# Patient Record
Sex: Female | Born: 1940
Health system: Southern US, Community
[De-identification: ages and names within clinical notes are randomized; demographics above are authoritative.]

## PROBLEM LIST (undated history)

## (undated) DIAGNOSIS — M359 Systemic involvement of connective tissue, unspecified: Secondary | ICD-10-CM

## (undated) DIAGNOSIS — K279 Peptic ulcer, site unspecified, unspecified as acute or chronic, without hemorrhage or perforation: Secondary | ICD-10-CM

## (undated) DIAGNOSIS — M1990 Unspecified osteoarthritis, unspecified site: Secondary | ICD-10-CM

## (undated) DIAGNOSIS — I10 Essential (primary) hypertension: Secondary | ICD-10-CM

## (undated) DIAGNOSIS — N189 Chronic kidney disease, unspecified: Secondary | ICD-10-CM

## (undated) DIAGNOSIS — E039 Hypothyroidism, unspecified: Secondary | ICD-10-CM

## (undated) DIAGNOSIS — J8410 Pulmonary fibrosis, unspecified: Secondary | ICD-10-CM

## (undated) HISTORY — DX: Peptic ulcer, site unspecified, unspecified as acute or chronic, without hemorrhage or perforation: K27.9

## (undated) HISTORY — DX: Systemic involvement of connective tissue, unspecified: M35.9

## (undated) HISTORY — PX: OTHER SURGICAL HISTORY: SHX169

## (undated) HISTORY — DX: Unspecified osteoarthritis, unspecified site: M19.90

---

## 2002-01-07 ENCOUNTER — Ambulatory Visit (HOSPITAL_COMMUNITY): Admission: RE | Admit: 2002-01-07 | Discharge: 2002-01-07 | Payer: Self-pay | Admitting: Specialist

## 2002-01-07 ENCOUNTER — Encounter: Payer: Self-pay | Admitting: Specialist

## 2002-01-19 ENCOUNTER — Ambulatory Visit (HOSPITAL_COMMUNITY): Admission: RE | Admit: 2002-01-19 | Discharge: 2002-01-19 | Payer: Self-pay | Admitting: *Deleted

## 2002-01-19 ENCOUNTER — Encounter: Payer: Self-pay | Admitting: Specialist

## 2003-01-28 ENCOUNTER — Encounter: Payer: Self-pay | Admitting: Family Medicine

## 2003-01-28 ENCOUNTER — Ambulatory Visit (HOSPITAL_COMMUNITY): Admission: RE | Admit: 2003-01-28 | Discharge: 2003-01-28 | Payer: Self-pay | Admitting: Family Medicine

## 2003-02-16 ENCOUNTER — Ambulatory Visit (HOSPITAL_COMMUNITY): Admission: RE | Admit: 2003-02-16 | Discharge: 2003-02-16 | Payer: Self-pay | Admitting: Family Medicine

## 2003-02-16 ENCOUNTER — Encounter: Payer: Self-pay | Admitting: Family Medicine

## 2003-05-31 ENCOUNTER — Ambulatory Visit (HOSPITAL_COMMUNITY): Admission: RE | Admit: 2003-05-31 | Discharge: 2003-05-31 | Payer: Self-pay | Admitting: Family Medicine

## 2003-05-31 ENCOUNTER — Encounter: Payer: Self-pay | Admitting: Family Medicine

## 2003-09-15 ENCOUNTER — Ambulatory Visit (HOSPITAL_COMMUNITY): Admission: RE | Admit: 2003-09-15 | Discharge: 2003-09-15 | Payer: Self-pay | Admitting: Internal Medicine

## 2003-09-16 ENCOUNTER — Ambulatory Visit (HOSPITAL_COMMUNITY): Admission: RE | Admit: 2003-09-16 | Discharge: 2003-09-16 | Payer: Self-pay | Admitting: General Surgery

## 2003-09-16 ENCOUNTER — Encounter: Payer: Self-pay | Admitting: General Surgery

## 2004-03-15 ENCOUNTER — Ambulatory Visit (HOSPITAL_COMMUNITY): Admission: RE | Admit: 2004-03-15 | Discharge: 2004-03-15 | Payer: Self-pay | Admitting: General Surgery

## 2005-04-03 ENCOUNTER — Ambulatory Visit (HOSPITAL_COMMUNITY): Admission: RE | Admit: 2005-04-03 | Discharge: 2005-04-03 | Payer: Self-pay | Admitting: Family Medicine

## 2006-04-04 ENCOUNTER — Ambulatory Visit (HOSPITAL_COMMUNITY): Admission: RE | Admit: 2006-04-04 | Discharge: 2006-04-04 | Payer: Self-pay | Admitting: Family Medicine

## 2007-03-21 ENCOUNTER — Ambulatory Visit (HOSPITAL_COMMUNITY): Admission: RE | Admit: 2007-03-21 | Discharge: 2007-03-21 | Payer: Self-pay | Admitting: Family Medicine

## 2007-10-17 ENCOUNTER — Ambulatory Visit (HOSPITAL_COMMUNITY): Admission: RE | Admit: 2007-10-17 | Discharge: 2007-10-17 | Payer: Self-pay | Admitting: Family Medicine

## 2007-11-11 ENCOUNTER — Ambulatory Visit (HOSPITAL_COMMUNITY): Admission: RE | Admit: 2007-11-11 | Discharge: 2007-11-11 | Payer: Self-pay | Admitting: Family Medicine

## 2008-04-14 ENCOUNTER — Ambulatory Visit (HOSPITAL_COMMUNITY): Admission: RE | Admit: 2008-04-14 | Discharge: 2008-04-14 | Payer: Self-pay | Admitting: Family Medicine

## 2009-05-10 ENCOUNTER — Ambulatory Visit (HOSPITAL_COMMUNITY): Admission: RE | Admit: 2009-05-10 | Discharge: 2009-05-10 | Payer: Self-pay | Admitting: Family Medicine

## 2009-12-29 ENCOUNTER — Ambulatory Visit (HOSPITAL_COMMUNITY): Admission: RE | Admit: 2009-12-29 | Discharge: 2009-12-29 | Payer: Self-pay | Admitting: Ophthalmology

## 2010-01-16 ENCOUNTER — Ambulatory Visit (HOSPITAL_COMMUNITY): Admission: RE | Admit: 2010-01-16 | Discharge: 2010-01-16 | Payer: Self-pay | Admitting: Family Medicine

## 2010-04-20 ENCOUNTER — Ambulatory Visit (HOSPITAL_COMMUNITY): Admission: RE | Admit: 2010-04-20 | Discharge: 2010-04-20 | Payer: Self-pay | Admitting: Ophthalmology

## 2010-06-13 ENCOUNTER — Ambulatory Visit (HOSPITAL_COMMUNITY): Admission: RE | Admit: 2010-06-13 | Discharge: 2010-06-13 | Payer: Self-pay | Admitting: Family Medicine

## 2010-06-21 ENCOUNTER — Ambulatory Visit (HOSPITAL_COMMUNITY): Admission: RE | Admit: 2010-06-21 | Discharge: 2010-06-21 | Payer: Self-pay | Admitting: Family Medicine

## 2011-03-05 ENCOUNTER — Other Ambulatory Visit (HOSPITAL_COMMUNITY): Payer: Self-pay | Admitting: Internal Medicine

## 2011-03-05 DIAGNOSIS — R7989 Other specified abnormal findings of blood chemistry: Secondary | ICD-10-CM

## 2011-03-05 DIAGNOSIS — R634 Abnormal weight loss: Secondary | ICD-10-CM

## 2011-03-06 ENCOUNTER — Encounter (HOSPITAL_COMMUNITY): Payer: Self-pay

## 2011-03-06 ENCOUNTER — Ambulatory Visit (HOSPITAL_COMMUNITY)
Admission: RE | Admit: 2011-03-06 | Discharge: 2011-03-06 | Disposition: A | Discharge status: Home / Self Care | Payer: Medicare Other | Source: Ambulatory Visit | Attending: Internal Medicine | Admitting: Internal Medicine

## 2011-03-06 DIAGNOSIS — R599 Enlarged lymph nodes, unspecified: Secondary | ICD-10-CM | POA: Insufficient documentation

## 2011-03-06 DIAGNOSIS — K7689 Other specified diseases of liver: Secondary | ICD-10-CM | POA: Insufficient documentation

## 2011-03-06 DIAGNOSIS — I10 Essential (primary) hypertension: Secondary | ICD-10-CM | POA: Insufficient documentation

## 2011-03-06 DIAGNOSIS — E039 Hypothyroidism, unspecified: Secondary | ICD-10-CM

## 2011-03-06 DIAGNOSIS — R933 Abnormal findings on diagnostic imaging of other parts of digestive tract: Secondary | ICD-10-CM | POA: Insufficient documentation

## 2011-03-06 DIAGNOSIS — R634 Abnormal weight loss: Secondary | ICD-10-CM

## 2011-03-06 DIAGNOSIS — R7989 Other specified abnormal findings of blood chemistry: Secondary | ICD-10-CM

## 2011-03-06 DIAGNOSIS — R748 Abnormal levels of other serum enzymes: Secondary | ICD-10-CM | POA: Insufficient documentation

## 2011-03-06 DIAGNOSIS — J479 Bronchiectasis, uncomplicated: Secondary | ICD-10-CM | POA: Insufficient documentation

## 2011-03-06 HISTORY — DX: Hypothyroidism, unspecified: E03.9

## 2011-03-06 HISTORY — DX: Essential (primary) hypertension: I10

## 2011-03-06 MED ORDER — IOHEXOL 300 MG/ML  SOLN
100.0000 mL | Freq: Once | INTRAMUSCULAR | Status: AC | PRN
  Administered 2011-03-06: 100 mL via INTRAVENOUS

## 2011-03-20 LAB — HEMOGLOBIN AND HEMATOCRIT, BLOOD
HCT: 39.8 % (ref 36.0–46.0)
Hemoglobin: 14 g/dL (ref 12.0–15.0)

## 2011-03-30 ENCOUNTER — Encounter (HOSPITAL_BASED_OUTPATIENT_CLINIC_OR_DEPARTMENT_OTHER): Payer: Medicare Other | Admitting: Internal Medicine

## 2011-03-30 ENCOUNTER — Ambulatory Visit (HOSPITAL_COMMUNITY)
Admission: RE | Admit: 2011-03-30 | Discharge: 2011-03-30 | Disposition: A | Discharge status: Home / Self Care | Payer: Medicare Other | Source: Ambulatory Visit | Attending: Internal Medicine | Admitting: Internal Medicine

## 2011-03-30 DIAGNOSIS — K2970 Gastritis, unspecified, without bleeding: Secondary | ICD-10-CM | POA: Insufficient documentation

## 2011-03-30 DIAGNOSIS — K449 Diaphragmatic hernia without obstruction or gangrene: Secondary | ICD-10-CM

## 2011-03-30 DIAGNOSIS — R933 Abnormal findings on diagnostic imaging of other parts of digestive tract: Secondary | ICD-10-CM

## 2011-03-30 DIAGNOSIS — K208 Other esophagitis without bleeding: Secondary | ICD-10-CM

## 2011-03-30 DIAGNOSIS — Z79899 Other long term (current) drug therapy: Secondary | ICD-10-CM | POA: Insufficient documentation

## 2011-03-30 DIAGNOSIS — I10 Essential (primary) hypertension: Secondary | ICD-10-CM | POA: Insufficient documentation

## 2011-03-30 DIAGNOSIS — K2900 Acute gastritis without bleeding: Secondary | ICD-10-CM

## 2011-03-30 DIAGNOSIS — R634 Abnormal weight loss: Secondary | ICD-10-CM | POA: Insufficient documentation

## 2011-03-30 DIAGNOSIS — K2990 Gastroduodenitis, unspecified, without bleeding: Secondary | ICD-10-CM | POA: Insufficient documentation

## 2011-03-30 LAB — SEDIMENTATION RATE: Sed Rate: 100 mm/hr — ABNORMAL HIGH (ref 0–22)

## 2011-03-31 LAB — CORTISOL: Cortisol, Plasma: 13.9 ug/dL

## 2011-04-02 LAB — BASIC METABOLIC PANEL
BUN: 12 mg/dL (ref 6–23)
CO2: 29 mEq/L (ref 19–32)
Calcium: 10 mg/dL (ref 8.4–10.5)
Chloride: 100 mEq/L (ref 96–112)
Creatinine, Ser: 1.12 mg/dL (ref 0.4–1.2)
GFR calc Af Amer: 59 mL/min — ABNORMAL LOW (ref >60)
GFR calc non Af Amer: 49 mL/min — ABNORMAL LOW (ref >60)
Glucose, Bld: 106 mg/dL — ABNORMAL HIGH (ref 70–99)
Potassium: 3.7 mEq/L (ref 3.5–5.1)
Sodium: 139 mEq/L (ref 135–145)

## 2011-04-05 ENCOUNTER — Ambulatory Visit (INDEPENDENT_AMBULATORY_CARE_PROVIDER_SITE_OTHER): Payer: BC Managed Care – PPO | Admitting: Internal Medicine

## 2011-04-10 NOTE — Op Note (Signed)
  Michelle Bonilla, Michelle Bonilla              ACCOUNT NO.:  [id number]  MEDICAL RECORD NO.:  [id number]           PATIENT TYPE:  O  LOCATION:  DAYP                          FACILITY:  [facility]  PHYSICIAN:  Lionel December, M.D.    DATE OF BIRTH:  October 30, 1941  DATE OF PROCEDURE:  03/30/2011 DATE OF DISCHARGE:                              OPERATIVE REPORT   PROCEDURE:  Esophagogastroduodenoscopy.  INDICATION:  Bridgitt is a 70 year old Afro-American female who has been gradually losing weight.  She states she has lost 20 pounds over the last year.  Her appetite has been fair.  She was seen by Dr. Juanetta Gosling. In addition to routine lab studies, she had abdominopelvic and chest CT. She was noted to have thickening of the gastric wall in the region of cardia, therefore direct visualization of this area was advised.  She denies epigastric pain, melena, nausea, vomiting, heartburn, or dysphagia.  Procedure risks were reviewed with the patient.  Informed consent was obtained.  MEDICATIONS FOR CONSCIOUS SEDATION:  Cetacaine spray for pharyngeal topical anesthesia, Demerol 25 mg IV, and Versed 4 mg IV.  FINDINGS:  Procedure was performed in endoscopy suite.  The patient's vital signs and O2 sat were monitored during the procedure and remained stable.  The patient was placed in left lateral recumbent position and Pentax videoscope was passed to oropharynx without any difficulty into esophagus.  Esophagus mucosa of the esophagus normal except there were two ulcers at GE junction and mucosa on the gastric site, Z-line was erythematous and edematous, but no stricturing was noted.  GE junction was located at 38 and hiatus was at 40.  Stomach, it was empty and distended very well with insufflation.  Folds of proximal stomach are normal.  Examination of mucosa revealed erythema and edema in the prepyloric/pyloric channel region, but pyloric channel was patent.  Angularis, fundus, and cardia were examined by  retroflexing scope were normal.  Duodenum, bulbar mucosa was normal.  Scope was passed in second part of duodenal mucosa and folds were normal.  Endoscope was withdrawn.  The patient tolerated the procedure well.  FINAL DIAGNOSES: 1. Ulcerative reflux esophagitis with 2 cm size hiatal hernia. 2. Focal gastritis at prepyloric/pyloric channel region, but no     evidence of peptic ulcer disease. 3. These findings would not necessarily explain the patient's     symptoms.  RECOMMENDATIONS: 1. Antireflux measures. 2. Pantoprazole 40 mg p.o. q.a.m.  We will drop in blood for sed rate, cortisol level, and H. pylori serology.     Lionel December, M.D.     NR/MEDQ  D:  03/30/2011  T:  03/31/2011  Job:  [id number]  cc:   Ramon Dredge L. Juanetta Gosling, M.D. Fax: [phone]  Electronically Signed by Lionel December M.D. on 04/10/2011 11:44:34 PM

## 2011-05-04 ENCOUNTER — Other Ambulatory Visit: Payer: Self-pay | Admitting: Rheumatology

## 2011-05-04 DIAGNOSIS — J8410 Pulmonary fibrosis, unspecified: Secondary | ICD-10-CM

## 2011-05-09 ENCOUNTER — Ambulatory Visit
Admission: RE | Admit: 2011-05-09 | Discharge: 2011-05-09 | Disposition: A | Discharge status: Home / Self Care | Payer: Medicare Other | Source: Ambulatory Visit | Attending: Rheumatology | Admitting: Rheumatology

## 2011-05-09 DIAGNOSIS — J8410 Pulmonary fibrosis, unspecified: Secondary | ICD-10-CM

## 2011-05-18 NOTE — Op Note (Signed)
   NAME:  Michelle Bonilla, Michelle Bonilla                        ACCOUNT NO.:  [id number]   MEDICAL RECORD NO.:  [id number]                   PATIENT TYPE:  AMB   LOCATION:  DAY                                  FACILITY:  [facility]   PHYSICIAN:  Lionel December, M.D.                 DATE OF BIRTH:  09/13/1942   DATE OF PROCEDURE:  DATE OF DISCHARGE:                                 OPERATIVE REPORT   PROCEDURE:  Total colonoscopy.   ENDOSCOPIST:  Lionel December, M.D.   INDICATIONS:  Michelle Bonilla is a 70 year old female who is undergoing screening  colonoscopy.  The procedure and risks were reviewed with the patient and  informed consent was obtained.   PREOPERATIVE MEDICATIONS:  Demerol 50 mg IV and Versed 6 mg IV in divided  dose.   FINDINGS:  Procedure performed in endoscopy suite.  The patient's vital  signs and O2 saturation were monitored during the procedure and remained  stable.  The patient was placed in the left lateral recumbent position and  rectal examination was performed.  No abnormality noted on external or  digital exam.   Olympus videoscope was placed in the rectum and advanced under vision into  the sigmoid colon and beyond.  Preparation was excellent.  Scope was passed  to the cecum which was identified by appendiceal orifice and/or stump and  the ileocecal valve.  Pictures were taken for the record.  As the scope was  withdrawn the colonic mucosa was once carefully examined.  There were 2  small diverticula at the right colon.  The rest of the colonic mucosa was  normal.  The rectal mucosa was also normal.   The scope was retroflexed to examine the anorectal junction; and she had  small hemorrhoids below the dentate line. The endoscope was straightened and  withdrawn.  The patient tolerated the procedure well.   FINAL DIAGNOSIS:  Two small diverticula of the right colon and small  external hemorrhoids, otherwise normal examination.    RECOMMENDATIONS:  1. She should continue yearly  Hemoccults when she has a physical exam.  2. High fiber diet.                                               Lionel December, M.D.    NR/MEDQ  D:  09/15/2003  T:  09/15/2003  Job:  [id number]   cc:   Patrica Duel, M.D.  [address], [address]  [place]  [place] [address]  Fax: [phone]

## 2011-05-23 ENCOUNTER — Encounter: Payer: Self-pay | Admitting: Internal Medicine

## 2011-05-24 ENCOUNTER — Encounter: Payer: Self-pay | Admitting: Internal Medicine

## 2011-05-24 ENCOUNTER — Ambulatory Visit (INDEPENDENT_AMBULATORY_CARE_PROVIDER_SITE_OTHER): Payer: Medicare Other | Admitting: Internal Medicine

## 2011-05-24 VITALS — BP 132/78 | HR 90 | Temp 97.8°F | Ht 62.5 in | Wt 102.4 lb

## 2011-05-24 DIAGNOSIS — J8410 Pulmonary fibrosis, unspecified: Secondary | ICD-10-CM

## 2011-05-24 NOTE — Progress Notes (Signed)
Subjective:     Patient ID: Michelle Bonilla, female   DOB: March 04, 1941, 70 y.o.   MRN: [id number]  HPI 62 yobf never smoked perfect health x hpb/ thyroid until Oct 2011 developed rash over chest some better with prednisone then hand swelling p stopped steroids  eval by Nickola Major dx connective tissue dz so restarted on prednisone 04/2011 and referred to Pulmonary clinic by Dr Nickola Major 05/2011 for ILD by CT   05/24/2011 Initial pulmonary office eval in EMR era  For ILD with no limiting sob or cough and good control of arthritis on prednisone at 5 mg three times a day.  Feels joint problems are much better. No unusual exposure hx or hx of taking chemo, amiodarone or macrodantin.  Pt denies any significant sore throat, dysphagia, itching, sneezing,  nasal congestion or excess/ purulent secretions,  fever, chills, sweats, unintended wt loss, pleuritic or exertional cp, hempoptysis, orthopnea pnd or leg swelling.    Also denies any obvious fluctuation of symptoms with weather or environmental changes or other aggravating or alleviating factors.     PMHx Pulmonary Fibrosis/ Connective Tissue dz       - onset Oct 2011 assoc with rash       - CT Chest 05/09/11 Stable chronic interstitial opacities in the lung bases most  compatible with fibrosis.          Mildly prominent bilateral axillary lymph nodes, left slightly  greater than right. These are stable          - 05/24/11 Walked 3 laps @ 185 ft each stopped due to  End of test sats down to 90 RA   but no symptoms    Review of Systems  Constitutional: Positive for unexpected weight change. Negative for fever and chills.  HENT: Negative for ear pain, nosebleeds, congestion, sore throat, rhinorrhea, sneezing, trouble swallowing, dental problem, voice change, postnasal drip and sinus pressure.   Eyes: Negative for visual disturbance.  Respiratory: Negative for cough, choking and shortness of breath.   Cardiovascular: Negative for chest pain and leg swelling.    Gastrointestinal: Negative for vomiting, abdominal pain and diarrhea.  Genitourinary: Negative for difficulty urinating.  Musculoskeletal: Negative for arthralgias.  Skin: Negative for rash.  Neurological: Negative for tremors, syncope and headaches.  Hematological: Does not bruise/bleed easily.       Objective:   Physical Exam Pleasant amb bf nad Wt 102 05/24/11 HEENT: nl dentition, turbinates, and orophanx. Nl external ear canals without cough reflex   NECK :  without JVD/Nodes/TM/ nl carotid upstrokes bilaterally   LUNGS: no acc muscle use, clear to A and P bilaterally without cough on insp or exp maneuvers. No sign crackles   CV:  RRR  no s3 or murmur,   No increase in P2, no edema   ABD:  soft and nontender with nl excursion in the supine position. No bruits or organomegaly, bowel sounds nl  MS:  warm without deformities, calf tenderness, cyanosis.  No clubbing  SKIN: warm and dry without lesions    NEURO:  alert, approp, no deficits    Assessment:         Plan:

## 2011-05-24 NOTE — Patient Instructions (Signed)
Please schedule a follow up office visit in 6 weeks, call sooner if needed with PFTs and CXR   You a have condition that's affecting your joints (pain/swelling) and your lungs (fibrosis with no symptoms) that is very very likely the exact same process and so we need to follow you regularly to make sure the treatment of your joints helps your lungs also.  Stay as active as possible and let me know if cough or shortness of breath with normal activities

## 2011-05-26 NOTE — Assessment & Plan Note (Signed)
DDx for pulmonary fibrosis  includes idiopathic pulmonary fibrosis, pulmonary fibrosis associated with connective tissue diseases (which have a relatively benign course in most cases) , adverse effect from  drugs such as chemotherapy or amiodarone exposure, nonspecific interstitial pneumonia which is typically steroid responsive, and chronic hypersensitivity pneumonitis.   In active  smokers Langerhan's Cell  Histiocyctosis (eosinophilic granuomatosis),  DIP,  and Respiratory Bronchiolitis ILD also need to be considered,    This fits best with PF assoc with CTdz and is presently asymptomatic but clearly present by CT and walking sats  So the key is serial f/u walking sats to put multiple points on the curve to determine response to therapy and also monitor therapy for potential adverse pulmonay effects,  Especially methotrexate if being considered.    Note that  her steroid response is quite striking historically . The goal with a chronic steroid dependent illness is always arriving at the lowest effective dose that controls the disease/symptoms and not accepting a set "formula" which is based on statistics or guidelines that don't always take into account patient  variability or the natural hx of the dz in every individual patient, which may well vary over time.  For now therefore I recommend the patient continue to follow Dr Westley Foots and we will just serve to observe/ monitor her progress from a pulmonary perspective.

## 2011-05-29 ENCOUNTER — Other Ambulatory Visit: Payer: Self-pay | Admitting: Rheumatology

## 2011-05-29 DIAGNOSIS — Z1231 Encounter for screening mammogram for malignant neoplasm of breast: Secondary | ICD-10-CM

## 2011-07-05 ENCOUNTER — Encounter: Payer: Self-pay | Admitting: Internal Medicine

## 2011-07-05 ENCOUNTER — Ambulatory Visit (INDEPENDENT_AMBULATORY_CARE_PROVIDER_SITE_OTHER): Payer: Medicare Other | Admitting: Internal Medicine

## 2011-07-05 ENCOUNTER — Ambulatory Visit (INDEPENDENT_AMBULATORY_CARE_PROVIDER_SITE_OTHER)
Admission: RE | Admit: 2011-07-05 | Discharge: 2011-07-05 | Disposition: A | Discharge status: Home / Self Care | Payer: Medicare Other | Source: Ambulatory Visit | Attending: Internal Medicine | Admitting: Internal Medicine

## 2011-07-05 ENCOUNTER — Ambulatory Visit
Admission: RE | Admit: 2011-07-05 | Discharge: 2011-07-05 | Disposition: A | Discharge status: Home / Self Care | Payer: Medicare Other | Source: Ambulatory Visit | Attending: Rheumatology | Admitting: Rheumatology

## 2011-07-05 VITALS — BP 142/80 | HR 100 | Temp 98.3°F | Ht 63.0 in | Wt 114.0 lb

## 2011-07-05 DIAGNOSIS — J8410 Pulmonary fibrosis, unspecified: Secondary | ICD-10-CM

## 2011-07-05 DIAGNOSIS — Z1231 Encounter for screening mammogram for malignant neoplasm of breast: Secondary | ICD-10-CM

## 2011-07-05 LAB — PULMONARY FUNCTION TEST

## 2011-07-05 NOTE — Progress Notes (Signed)
Subjective:     Patient ID: Michelle Bonilla, female   DOB: 1941/08/08, 70 y.o.   MRN: [id number]  HPI 46 yobf never smoked perfect health x hpb/ thyroid until Oct 2011 developed rash over chest some better with prednisone then hand swelling p stopped steroids  eval by Nickola Major dx connective tissue dz so restarted on prednisone 04/2011 and referred to Pulmonary clinic by Dr Nickola Major 05/2011 for ILD by CT   05/24/2011 Initial pulmonary office eval in EMR era  For ILD with no limiting sob or cough and good control of arthritis on prednisone at 5 mg three times a day.  Feels joint problems are much better. No unusual exposure hx or hx of taking chemo, amiodarone or macrodantin. rec return for pft's/cxr   07/05/2011 ov/Randie Bloodgood cc no significant limiting sob or cough, no flare of arthritis since on prednisone but considering mtx per Dr Nickola Major.    Pt denies any significant sore throat, dysphagia, itching, sneezing,  nasal congestion or excess/ purulent secretions,  fever, chills, sweats, unintended wt loss, pleuritic or exertional cp, hempoptysis, orthopnea pnd or leg swelling.    Also denies any obvious fluctuation of symptoms with weather or environmental changes or other aggravating or alleviating factors.   [name]     PMHx Pulmonary Fibrosis/ Connective Tissue dz................Marland KitchenMarland KitchenZenovia Jordan       - onset Oct 2011 assoc with rash       - Prednisone started 05/2011        - CT Chest 05/09/11 Stable chronic interstitial opacities in the lung bases most  compatible with fibrosis.          Mildly prominent bilateral axillary lymph nodes, left slightly  greater than right. These are stable          - 05/24/11 Walked 3 laps @ 185 ft each stopped due to  End of test sats down to 90 RA   but no symptoms         - 07/05/11 Walked 2 laps @ 185 ft each stopped due to  desat to 86% on RA        Objective:   Physical Exam  Pleasant amb bf nad  Wt 102 05/24/11 >  114 07/05/11  HEENT: nl dentition, turbinates, and  orophanx. Nl external ear canals without cough reflex   NECK :  without JVD/Nodes/TM/ nl carotid upstrokes bilaterally   LUNGS: no acc muscle use, clear to A and P bilaterally without cough on insp or exp maneuvers. No sign crackles   CV:  RRR  no s3 or murmur,   No increase in P2, no edema   ABD:  soft and nontender with nl excursion in the supine position. No bruits or organomegaly, bowel sounds nl  MS:  warm without deformities, calf tenderness, cyanosis.  No clubbing  SKIN: warm and dry without lesions / nodules     cxr 07/05/11 Stable peripheral reticular opacities in the bilateral lower lobes, corresponding to suspected pulmonary fibrosis.   Assessment:         Plan:

## 2011-07-05 NOTE — Patient Instructions (Signed)
Methotrexate's benefit is worth the risk to reduce prednisone need but does carry the risk of worsening your breathing and if this happens you first notice it on exertion and should call me   Please schedule a follow up office visit in 6 weeks, call sooner if needed

## 2011-07-05 NOTE — Progress Notes (Signed)
PFT done today. 

## 2011-07-06 ENCOUNTER — Encounter: Payer: Self-pay | Admitting: Internal Medicine

## 2011-07-06 NOTE — Assessment & Plan Note (Signed)
I had an extended discussion with the patient and daughter  today lasting 15 to 20 minutes of a 25 minute visit on the following issues:   Now desaturating p 2 laps rapid walk  but still asymptomatic with pft's c/w mild restriction and DLC0 49% and cxr very mild > good candidate for trial of mtx but cautioned this could have pulmonary side effects and needs to walk regularly at slower pace to monitor for worsening ild as well as establish multiple point on the curve in the pulmonary clinic

## 2011-08-08 ENCOUNTER — Encounter: Payer: Self-pay | Admitting: Internal Medicine

## 2011-08-22 ENCOUNTER — Encounter: Payer: Self-pay | Admitting: Internal Medicine

## 2011-08-22 ENCOUNTER — Ambulatory Visit (INDEPENDENT_AMBULATORY_CARE_PROVIDER_SITE_OTHER): Payer: Medicare Other | Admitting: Internal Medicine

## 2011-08-22 VITALS — BP 136/80 | HR 100 | Temp 97.5°F | Ht 62.0 in | Wt 121.2 lb

## 2011-08-22 DIAGNOSIS — J8410 Pulmonary fibrosis, unspecified: Secondary | ICD-10-CM

## 2011-08-22 NOTE — Assessment & Plan Note (Signed)
Improving and no longer desats with activity.  F/u can be prn as long as she maintains such good activity tolerance.

## 2011-08-22 NOTE — Progress Notes (Signed)
Subjective:     Patient ID: Michelle Bonilla, female   DOB: 1941-11-13, 70 y.o.   MRN: [id number]  HPI  67 yobf never smoked perfect health x hpb/ thyroid until Oct 2011 developed rash over chest some better with prednisone then hand swelling p stopped steroids  eval by Michelle Bonilla dx connective tissue dz so restarted on prednisone 04/2011 and referred to Pulmonary clinic by Dr Michelle Bonilla 05/2011 for ILD by CT   05/24/2011 Initial pulmonary office eval in EMR era  For ILD with no limiting sob or cough and good control of arthritis on prednisone at 5 mg three times a day.  Feels joint problems are much better. No unusual exposure hx or hx of taking chemo, amiodarone or macrodantin. rec return for pft's/cxr   07/05/2011 ov/Michelle Bonilla cc no significant limiting sob or cough, no flare of arthritis since on prednisone but considering mtx per Dr Michelle Bonilla.   - PFT's 07/05/2011 VC   1.75   74% ratio 89,  DLCO 49% corrects to 84   rec Methotrexate's benefit is worth the risk to reduce prednisone need but does carry the risk of worsening your breathing and if this happens you first notice it on exertion and should call me     08/22/2011 f/u ov/Michelle Bonilla cc less sob, able to walk around a track x 45 minutes at a time s stopping and arthritis is improving. No cough  Sleeping ok without nocturnal  or early am exac of resp c/o's. Pt denies any significant sore throat, dysphagia, itching, sneezing,  nasal congestion or excess/ purulent secretions,  fever, chills, sweats, unintended wt loss, pleuritic or exertional cp, hempoptysis, orthopnea pnd or leg swelling.    Also denies any obvious fluctuation of symptoms with weather or environmental changes or other aggravating or alleviating factors.      [name]     PMHx Pulmonary Fibrosis/ Connective Tissue dz................Marland KitchenMarland KitchenZenovia Bonilla       - onset Oct 2011 assoc with rash       - Prednisone started 05/2011        - CT Chest 05/09/11 Stable chronic interstitial opacities in the lung bases  most  compatible with fibrosis.          Mildly prominent bilateral axillary lymph nodes, left slightly  greater than right. These are stable          - 05/24/11 Walked 3 laps @ 185 ft each stopped due to  End of test sats down to 90 RA   but no symptoms         - 07/05/11 Walked 2 laps @ 185 ft each stopped due to  desat to 86% on RA       - 08/22/2011 Walked RA x 3 laps @ 185 ft each stopped due to  End of study/ no desat       Objective:   Physical Exam  Pleasant amb bf nad  Wt 102 05/24/11 >  114 07/05/11  > 121 08/22/2011   HEENT: nl dentition, turbinates, and orophanx. Nl external ear canals without cough reflex   NECK :  without JVD/Nodes/TM/ nl carotid upstrokes bilaterally   LUNGS: no acc muscle use, clear to A and P bilaterally without cough on insp or exp maneuvers. No sign crackles/minimal BV changes both bases   CV:  RRR  no s3 or murmur,   No increase in P2, no edema   ABD:  soft and nontender with nl excursion in the supine position. No bruits  or organomegaly, bowel sounds nl  MS:  warm without deformities, calf tenderness, cyanosis.  No clubbing  SKIN: warm and dry without lesions / nodules     cxr 07/05/11 Stable peripheral reticular opacities in the bilateral lower lobes, corresponding to suspected pulmonary fibrosis.   Assessment:         Plan:

## 2011-08-22 NOTE — Patient Instructions (Signed)
GERD (REFLUX)  is an extremely common cause of respiratory symptoms, many times with no significant heartburn at all.    It can be treated with medication, but also with lifestyle changes including avoidance of late meals, excessive alcohol, smoking cessation, and avoid fatty foods, chocolate, peppermint, colas, red wine, and acidic juices such as orange juice.  NO MINT OR MENTHOL PRODUCTS SO NO COUGH DROPS  USE SUGARLESS CANDY INSTEAD (jolley ranchers or Stover's)  NO OIL BASED VITAMINS (no fish oil)   Please schedule a follow up visit in 6  months but call sooner if needed - let me know if your  tolerance to track walking declines.

## 2012-02-21 ENCOUNTER — Ambulatory Visit: Payer: Medicare Other | Admitting: Internal Medicine

## 2012-04-02 ENCOUNTER — Ambulatory Visit (INDEPENDENT_AMBULATORY_CARE_PROVIDER_SITE_OTHER): Payer: Medicare Other | Admitting: Internal Medicine

## 2012-04-02 ENCOUNTER — Encounter: Payer: Self-pay | Admitting: Internal Medicine

## 2012-04-02 VITALS — BP 142/80 | HR 101 | Temp 98.5°F | Ht 62.5 in | Wt 109.8 lb

## 2012-04-02 DIAGNOSIS — J8410 Pulmonary fibrosis, unspecified: Secondary | ICD-10-CM

## 2012-04-02 DIAGNOSIS — I10 Essential (primary) hypertension: Secondary | ICD-10-CM

## 2012-04-02 NOTE — Assessment & Plan Note (Signed)
ACE inhibitors are problematic in  pts with airway complaints because  even experienced pulmonologists can't always distinguish ace effects from copd/asthma/pnds/ allergies etc.  By themselves they don't actually cause a problem, much like oxygen can't by itself start a fire, but they certainly serve as a powerful catalyst or enhancer for any "fire"  or inflammatory process in the upper airway, be it caused by an ET  tube or more commonly reflux (especially in the obese or pts with known GERD or who are on biphoshonates) or URI's, due to interference with bradykinin clearance.  The effects of acei on bradykinin levels occurs in 100% of pt's on acei (unless they surreptitiously stop the med!) but the classic cough is only reported in 5%.  This leaves 95% of pts on acei's  with a variety of syndromes including no identifiable symptom in most  vs non-specific symptoms that wax and wane depending on what other insult is occuring at the level of the upper airway.   She has urge to clear her throat more bothersome to daughter than pt but likely an acei effect > would prefer ARB if possible given that cough is one of the symptoms we're looking for to monitor her ILD

## 2012-04-02 NOTE — Assessment & Plan Note (Signed)
-   onset Oct 2011 assoc with rash       - CT Chest 05/09/11 Stable chronic interstitial opacities in the lung bases most  compatible with fibrosis.          Mildly prominent bilateral axillary lymph nodes, left slightly  greater than right. These are stable          - 05/24/11 Walked 3 laps @ 185 ft each stopped due to  End of test sats down to 90 RA   but no symptoms       - 07/05/2011 Walked 2 laps @ 185 ft each stopped due to desat to 86% RA at rapid pace, no symptoms       - PFT's 07/05/2011 VC   1.75   74% ratio 89,  DLCO 49% corrects to 84         - Methotrexate  started  07/2011       - 08/22/2011 Walked RA x 3 laps @ 185 ft each stopped due to  End of study/ no desat  No evidence of worsening ild, minimal cough more likely acei than related to ild

## 2012-04-02 NOTE — Progress Notes (Signed)
Subjective:     Patient ID: Michelle Bonilla, female   DOB: 12-12-1941    MRN: [id number]  Brief patient profile:  32 yobf never smoked perfect health x hpb/ thyroid until Oct 2011 developed rash over chest some better with prednisone then hand swelling p stopped steroids  eval by Nickola Major dx connective tissue dz so restarted on prednisone 04/2011 and referred to Pulmonary clinic by Dr Nickola Major 05/2011 for ILD by CT   05/24/2011 Initial pulmonary office eval in EMR era  For ILD with no limiting sob or cough and good control of arthritis on prednisone at 5 mg three times a day.  Feels joint problems are much better. No unusual exposure hx or hx of taking chemo, amiodarone or macrodantin. rec return for pft's/cxr   07/05/2011 ov/Ahriyah Vannest cc no significant limiting sob or cough, no flare of arthritis since on prednisone but considering mtx per Dr Nickola Major.   - PFT's 07/05/2011 VC   1.75   74% ratio 89,  DLCO 49% corrects to 84   rec Methotrexate's benefit is worth the risk to reduce prednisone need but does carry the risk of worsening your breathing and if this happens you first notice it on exertion and should call me     08/22/2011 f/u ov/Jakayden Cancio cc less sob, able to walk around a track x 45 minutes at a time s stopping and arthritis is improving. No cough rec gerd diet Monitor ability to walk track   04/02/2012 f/u ov/Gailya Tauer cc no change doe, not doing track due to weather but plans to start again, "not coughing" but clearing throat a lot since started on acei, attibutes this to "sinus drainage" but didn't have it before. Arthritis good shape  Sleeping ok without nocturnal  or early am exac of resp c/o's. Pt denies any significant sore throat, dysphagia, itching, sneezing,  nasal congestion or excess/ purulent secretions,  fever, chills, sweats, unintended wt loss, pleuritic or exertional cp, hempoptysis, orthopnea pnd or leg swelling.    Also denies any obvious fluctuation of symptoms with weather or environmental  changes or other aggravating or alleviating factors.      [name]     PMHx Pulmonary Fibrosis/ Connective Tissue dz................Marland KitchenMarland KitchenZenovia Jordan       - onset Oct 2011 assoc with rash       - Prednisone started 05/2011        - CT Chest 05/09/11 Stable chronic interstitial opacities in the lung bases most  compatible with fibrosis.          Mildly prominent bilateral axillary lymph nodes, left slightly  greater than right. These are stable          - 05/24/11 Walked 3 laps @ 185 ft each stopped due to  End of test sats down to 90 RA   but no symptoms         - 07/05/11 Walked 2 laps @ 185 ft each stopped due to  desat to 86% on RA       - 08/22/2011 Walked RA x 3 laps @ 185 ft each stopped due to  End of study/ no desat       Objective:   Physical Exam  Pleasant amb bf nad with freq throat clearing during interview  Wt 102 05/24/11 >  114 07/05/11  > 121 08/22/2011  > 04/02/2012  109 HEENT: nl dentition, turbinates, and orophanx. Nl external ear canals without cough reflex   NECK :  without JVD/Nodes/TM/ nl carotid upstrokes  bilaterally   LUNGS: no acc muscle use, clear to A and P bilaterally without cough on insp or exp maneuvers. No sign crackles/minimal BV changes both bases   CV:  RRR  no s3 or murmur,   No increase in P2, no edema   ABD:  soft and nontender with nl excursion in the supine position. No bruits or organomegaly, bowel sounds nl  MS:  warm without deformities, calf tenderness, cyanosis.  No clubbing  SKIN: warm and dry without lesions / nodules     cxr 07/05/11 Stable peripheral reticular opacities in the bilateral lower lobes, corresponding to suspected pulmonary fibrosis.   Assessment:         Plan:

## 2012-04-02 NOTE — Patient Instructions (Signed)
Please schedule a follow up visit in 3 months but call sooner if needed   If throat clearing bothers you it is probably from the lisinopril which will need to be stopped but your blood pressure will need to be monitored carefully and alternatives to ace inhibitors considered

## 2012-06-13 ENCOUNTER — Other Ambulatory Visit: Payer: Self-pay | Admitting: Rheumatology

## 2012-06-13 DIAGNOSIS — Z1231 Encounter for screening mammogram for malignant neoplasm of breast: Secondary | ICD-10-CM

## 2012-07-04 ENCOUNTER — Ambulatory Visit (INDEPENDENT_AMBULATORY_CARE_PROVIDER_SITE_OTHER): Payer: Medicare Other | Admitting: Internal Medicine

## 2012-07-04 ENCOUNTER — Encounter: Payer: Self-pay | Admitting: Internal Medicine

## 2012-07-04 VITALS — BP 158/90 | HR 92 | Temp 97.2°F | Ht 62.5 in | Wt 102.6 lb

## 2012-07-04 DIAGNOSIS — J8410 Pulmonary fibrosis, unspecified: Secondary | ICD-10-CM

## 2012-07-04 DIAGNOSIS — I10 Essential (primary) hypertension: Secondary | ICD-10-CM

## 2012-07-04 NOTE — Progress Notes (Signed)
Subjective:     Patient ID: Michelle Bonilla, female   DOB: 1941-12-29    MRN: [id number]   Brief patient profile:  63 yobf never smoked perfect health x hpb/ thyroid until Oct 2011 developed rash over chest some better with prednisone then hand swelling p stopped steroids  eval by Nickola Major dx connective tissue dz so restarted on prednisone 04/2011 and referred to Pulmonary clinic by Dr Nickola Major 05/2011 for ILD by CT   05/24/2011 Initial pulmonary office eval in EMR era  For ILD with no limiting sob or cough and good control of arthritis on prednisone at 5 mg three times a day.  Feels joint problems are much better. No unusual exposure hx or hx of taking chemo, amiodarone or macrodantin. rec return for pft's/cxr   07/05/2011 ov/Eupha Lobb cc no significant limiting sob or cough, no flare of arthritis since on prednisone but considering mtx per Dr Nickola Major.   - PFT's 07/05/2011 VC   1.75   74% ratio 89,  DLCO 49% corrects to 84   rec Methotrexate's benefit is worth the risk to reduce prednisone need but does carry the risk of worsening your breathing and if this happens you first notice it on exertion and should call me     08/22/2011 f/u ov/Letita Prentiss cc less sob, able to walk around a track x 45 minutes at a time s stopping and arthritis is improving. No cough rec gerd diet Monitor ability to walk track   04/02/2012 f/u ov/Lemont Sitzmann cc no change doe, not doing track due to weather but plans to start again, "not coughing" but clearing throat a lot since started on acei, attibutes this to "sinus drainage" but didn't have it before started acei. Arthritis good shape rec Please schedule a follow up visit in 3 months but call sooner if needed - consider d/c acei > done.   07/04/2012 f/u ov/Jonavon Trieu cc back to normal walking and throat problem completely cleared off acei, arthritis doing fine.    No unusual cough, purulent sputum or sinus/hb symptoms on present rx. No obvious daytime variabilty or   cp or chest tightness, subjective  wheeze. No unusual exp hx        Sleeping ok without nocturnal  or early am exac of resp c/o's. Also denies any obvious fluctuation of symptoms with weather or environmental changes or other aggravating or alleviating factors.      [name]     PMHx Pulmonary Fibrosis/ Connective Tissue dz................Marland KitchenMarland KitchenZenovia Jordan       - onset Oct 2011 assoc with rash       - Prednisone started 05/2011        - CT Chest 05/09/11 Stable chronic interstitial opacities in the lung bases most  compatible with fibrosis.          Mildly prominent bilateral axillary lymph nodes, left slightly  greater than right. These are stable           Objective:   Physical Exam  Pleasant amb bf nad    Wt 102 05/24/11 >  114 07/05/11  > 121 08/22/2011  > 04/02/2012  109 > 07/04/2012  102 HEENT: nl dentition, turbinates, and orophanx. Nl external ear canals without cough reflex   NECK :  without JVD/Nodes/TM/ nl carotid upstrokes bilaterally   LUNGS: no acc muscle use, clear to A and P bilaterally without cough on insp or exp maneuvers. No sign crackles/minimal BV changes both bases   CV:  RRR  no s3 or murmur,  No increase in P2, no edema   ABD:  soft and nontender with nl excursion in the supine position. No bruits or organomegaly, bowel sounds nl  MS:  warm without deformities, calf tenderness, cyanosis.  No clubbing  SKIN: warm and dry without lesions / nodules     cxr 07/05/11 Stable peripheral reticular opacities in the bilateral lower lobes, corresponding to suspected pulmonary fibrosis.   Assessment:         Plan:

## 2012-07-04 NOTE — Assessment & Plan Note (Signed)
Clearly better off acei in terms of airway complaints, all be they non-specific "throat congestion" related  Defer decision re rx to Dr Clent Ridges but would avoid acei's since they probably contribute in her case to the symptoms we'd be looking for to monitor her ILD for lack of progression.

## 2012-07-04 NOTE — Patient Instructions (Addendum)
Return if Dr Nickola Major needs you have yearly pulmonary function tests or if you notice you are losing any ground with your exercise tolerance

## 2012-07-04 NOTE — Assessment & Plan Note (Signed)
-   onset Oct 2011 assoc with rash       - CT Chest 05/09/11 Stable chronic interstitial opacities in the lung bases most  compatible with fibrosis.          Mildly prominent bilateral axillary lymph nodes, left slightly  greater than right. These are stable          - 05/24/11 Walked 3 laps @ 185 ft each stopped due to  End of test sats down to 90 RA   but no symptoms       - 07/05/2011 Walked 2 laps @ 185 ft each stopped due to desat to 86% RA at rapid pace, no symptoms       - PFT's 07/05/2011 VC   1.75   74% ratio 89,  DLCO 49% corrects to 84         - Methotrexate rx 07/2011>>>       -07/04/2012  Walked RA x 3 laps @ 185 ft each stopped due to  End of study, sats 90RA  Walking sats identical now to what they were 05/24/11 so no evidence of dz progression.  If decline in ex tol or needed by Dr Nickola Major for monitoring RA can do yearly cxr/ pft's here, otherwise f/u can be prn.

## 2012-07-10 ENCOUNTER — Ambulatory Visit
Admission: RE | Admit: 2012-07-10 | Discharge: 2012-07-10 | Disposition: A | Discharge status: Home / Self Care | Payer: Medicare Other | Source: Ambulatory Visit | Attending: Rheumatology | Admitting: Rheumatology

## 2012-07-10 DIAGNOSIS — Z1231 Encounter for screening mammogram for malignant neoplasm of breast: Secondary | ICD-10-CM

## 2012-07-15 ENCOUNTER — Other Ambulatory Visit: Payer: Self-pay | Admitting: Rheumatology

## 2012-07-15 DIAGNOSIS — R928 Other abnormal and inconclusive findings on diagnostic imaging of breast: Secondary | ICD-10-CM

## 2012-07-24 ENCOUNTER — Ambulatory Visit
Admission: RE | Admit: 2012-07-24 | Discharge: 2012-07-24 | Disposition: A | Discharge status: Home / Self Care | Payer: Medicare Other | Source: Ambulatory Visit | Attending: Rheumatology | Admitting: Rheumatology

## 2012-07-24 DIAGNOSIS — R928 Other abnormal and inconclusive findings on diagnostic imaging of breast: Secondary | ICD-10-CM

## 2012-12-05 ENCOUNTER — Other Ambulatory Visit: Payer: Self-pay | Admitting: Rheumatology

## 2012-12-05 DIAGNOSIS — R918 Other nonspecific abnormal finding of lung field: Secondary | ICD-10-CM

## 2012-12-08 ENCOUNTER — Ambulatory Visit
Admission: RE | Admit: 2012-12-08 | Discharge: 2012-12-08 | Disposition: A | Discharge status: Home / Self Care | Payer: Medicare Other | Source: Ambulatory Visit | Attending: Rheumatology | Admitting: Rheumatology

## 2012-12-08 ENCOUNTER — Other Ambulatory Visit: Payer: Medicare Other

## 2012-12-08 DIAGNOSIS — R918 Other nonspecific abnormal finding of lung field: Secondary | ICD-10-CM

## 2012-12-10 ENCOUNTER — Telehealth: Payer: Self-pay | Admitting: Internal Medicine

## 2012-12-10 NOTE — Telephone Encounter (Signed)
Called and spoke with pt to remind her to be sure and keep 12/17/12 ov with Dr. Sherene Sires to f/u recent CT Chest. Pt verbalized understanding of appt date, time and location and states will keep appt.

## 2012-12-17 ENCOUNTER — Encounter: Payer: Self-pay | Admitting: Internal Medicine

## 2012-12-17 ENCOUNTER — Other Ambulatory Visit (INDEPENDENT_AMBULATORY_CARE_PROVIDER_SITE_OTHER): Payer: Medicare Other

## 2012-12-17 ENCOUNTER — Ambulatory Visit (INDEPENDENT_AMBULATORY_CARE_PROVIDER_SITE_OTHER): Payer: Medicare Other | Admitting: Internal Medicine

## 2012-12-17 VITALS — BP 114/70 | HR 101 | Temp 99.0°F | Ht 62.0 in | Wt 93.0 lb

## 2012-12-17 DIAGNOSIS — J8410 Pulmonary fibrosis, unspecified: Secondary | ICD-10-CM

## 2012-12-17 LAB — CBC WITH DIFFERENTIAL/PLATELET
Basophils Absolute: 0 10*3/uL (ref 0.0–0.1)
Basophils Relative: 0.4 % (ref 0.0–3.0)
Eosinophils Absolute: 0.2 10*3/uL (ref 0.0–0.7)
Eosinophils Relative: 3.9 % (ref 0.0–5.0)
HCT: 33.4 % — ABNORMAL LOW (ref 36.0–46.0)
Hemoglobin: 11.1 g/dL — ABNORMAL LOW (ref 12.0–15.0)
Lymphocytes Relative: 16.4 % (ref 12.0–46.0)
Lymphs Abs: 1 10*3/uL (ref 0.7–4.0)
MCHC: 33.3 g/dL (ref 30.0–36.0)
MCV: 93.1 fl (ref 78.0–100.0)
Monocytes Absolute: 0.4 10*3/uL (ref 0.1–1.0)
Monocytes Relative: 7.3 % (ref 3.0–12.0)
Neutro Abs: 4.4 10*3/uL (ref 1.4–7.7)
Neutrophils Relative %: 72 % (ref 43.0–77.0)
Platelets: 433 10*3/uL — ABNORMAL HIGH (ref 150.0–400.0)
RBC: 3.59 Mil/uL — ABNORMAL LOW (ref 3.87–5.11)
RDW: 15.2 % — ABNORMAL HIGH (ref 11.5–14.6)
WBC: 6.1 10*3/uL (ref 4.5–10.5)

## 2012-12-17 MED ORDER — PREDNISONE 10 MG PO TABS: ORAL_TABLET | ORAL | Status: DC

## 2012-12-17 NOTE — Assessment & Plan Note (Signed)
-   onset Oct 2011 assoc with rash       - CT Chest 05/09/11 Stable chronic interstitial opacities in the lung bases most  compatible with fibrosis.          Mildly prominent bilateral axillary lymph nodes, left slightly  greater than right. These are stable          - 05/24/11 Walked 3 laps @ 185 ft each stopped due to  End of test sats down to 90 RA   but no symptoms       - 07/05/2011 Walked 2 laps @ 185 ft each stopped due to desat to 86% RA at rapid pace, no symptoms       - PFT's 07/05/2011 VC   1.75   74% ratio 89,  DLCO 49% corrects to 84         - Methotrexate rx 07/2011>>> 12/17/2012 ? mtx toxicity       -07/04/2012  Walked RA x 3 laps @ 185 ft each stopped due to  End of study, sats       - 12/17/2012  Walked RA  2 laps @ 185 ft each stopped due to  desat to 75%   DDx for pulmonary fibrosis  includes idiopathic pulmonary fibrosis, pulmonary fibrosis associated with rheumatologic diseases (which have a relatively benign course in most cases) , adverse effect from  drugs such as chemotherapy or amiodarone exposure, nonspecific interstitial pneumonia which is typically steroid responsive, and chronic hypersensitivity pneumonitis.   In active  smokers Langerhan's Cell  Histiocyctosis (eosinophilic granuomatosis),  DIP,  and Respiratory Bronchiolitis ILD also need to be considered,    The fact her breathing and cough deteriorating while joints better on mtx strongly points toward possible mtx toxicity and away from pf related to RA  Discussed in detail all the  indications, usual  risks and alternatives  relative to the benefits with patient who agrees to proceed with trial off mtx for now and back on pred at 10 mg bid x 4-6 weeks.  The goal with a chronic steroid dependent illness is always arriving at the lowest effective dose that controls the disease/symptoms and not accepting a set "formula" which is based on statistics or guidelines that don't always take into account patient  variability or  the natural hx of the dz in every individual patient, which may well vary over time.  For now therefore I recommend the patient maintain  A ceiling of 20 mg and a floor of 10 mg per day for now

## 2012-12-17 NOTE — Patient Instructions (Addendum)
Continue protonix 40 mg Take 30-60 min before first meal of the day   Prednisone 10 mg twice daily with meals and once better, one half twice daily  Please schedule a follow up office visit in 6 weeks, call sooner if needed with pft's

## 2012-12-17 NOTE — Progress Notes (Signed)
Subjective:     Patient ID: Michelle Bonilla, female   DOB: Jun 16, 1941    MRN: [id number]   Brief patient profile:  61 yobf never smoked perfect health x hpb/ thyroid until Oct 2011 developed rash over chest some better with prednisone then hand swelling p stopped steroids  eval by Michelle Bonilla dx connective tissue dz so restarted on prednisone 04/2011 and referred to Pulmonary clinic by Dr Michelle Bonilla 05/2011 for ILD by CT   05/24/2011 Initial pulmonary office eval in EMR era  For ILD with no limiting sob or cough and good control of arthritis on prednisone at 5 mg three times a day.  Feels joint problems are much better. No unusual exposure hx or hx of taking chemo, amiodarone or macrodantin. rec return for pft's/cxr   07/05/2011 ov/Wert cc no significant limiting sob or cough, no flare of arthritis since on prednisone but considering mtx per Dr Michelle Bonilla.   - PFT's 07/05/2011 VC   1.75   74% ratio 89,  DLCO 49% corrects to 84   rec  Methotrexate's benefit is worth the risk to reduce prednisone need but does carry the risk of worsening your breathing and if this happens you first notice it on exertion and should call me     08/22/2011 f/u ov/Wert cc less sob, able to walk around a track x 45 minutes at a time s stopping and arthritis is improving. No cough rec gerd diet Monitor ability to walk track   04/02/2012 f/u ov/Wert cc no change doe, not doing track due to weather but plans to start again, "not coughing" but clearing throat a lot since started on acei, attibutes this to "sinus drainage" but didn't have it before started acei. Arthritis good shape rec Please schedule a follow up visit in 3 months but call sooner if needed - consider d/c acei > done.   07/04/2012 f/u ov/Wert cc back to normal walking and throat problem completely cleared off acei, arthritis doing fine off prednisone for months s flare. rec  f/u prn   12/17/2012 f/u ov/Wert ccindolent onset progressive sense she's loosing ground with  activity tol, fatigue since off prednisone and sev months dry cough despite off acei and on ppi daily.  Arthritis well controlled as mtx tapered up  No purulent sputum or sinus/hb symptoms on present rx. No obvious daytime variabilty or   cp or chest tightness, subjective wheeze. No unusual exp hx     Sleeping ok without nocturnal  or early am exac of resp c/o's. Also denies any obvious fluctuation of symptoms with weather or environmental changes or other aggravating or alleviating factors.    ROS  The following are not active complaints unless bolded sore throat, dysphagia, dental problems, itching, sneezing,  nasal congestion or excess/ purulent secretions, ear ache,   fever, chills, sweats, unintended wt loss, pleuritic or exertional cp, hemoptysis,  orthopnea pnd or leg swelling, presyncope, palpitations, heartburn, abdominal pain, anorexia, nausea, vomiting, diarrhea  or change in bowel or urinary habits, change in stools or urine, dysuria,hematuria,  rash, arthralgias, visual complaints, headache, numbness weakness or ataxia or problems with walking or coordination,  change in mood/affect or memory.        Michelle Bonilla Kitchen     PMHx Pulmonary Fibrosis/ Connective Tissue dz................Michelle Bonilla KitchenMarland KitchenZenovia Bonilla       - onset Oct 2011 assoc with rash       - Prednisone started 05/2011        - CT Chest 05/09/11 Stable chronic interstitial  opacities in the lung bases most  compatible with fibrosis.          Mildly prominent bilateral axillary lymph nodes, left slightly  greater than right. These are stable           Objective:   Physical Exam  Pleasant amb bf nad    Wt 102 05/24/11 >  114 07/05/11  > 121 08/22/2011  > 04/02/2012  109 > 07/04/2012  102 > 93 12/17/2012  HEENT: nl dentition, turbinates, and orophanx. Nl external ear canals without cough reflex   NECK :  without JVD/Nodes/TM/ nl carotid upstrokes bilaterally   LUNGS: no acc muscle use, clear to A and P bilaterally without cough on insp or exp  maneuvers. No sign crackles/minimal BV changes both bases   CV:  RRR  no s3 or murmur,   No increase in P2, no edema   ABD:  soft and nontender with nl excursion in the supine position. No bruits or organomegaly, bowel sounds nl  MS:  warm without deformities, calf tenderness, cyanosis.  No clubbing  SKIN: warm and dry without lesions / nodules     Ct 12/08/12 Progressive pulmonary fibrosis and bronchiectasis. No pulmonary  masses. The new densities on chest x-ray represent focal areas of  fibrosis and secondary inflammation.    Assessment:         Plan:

## 2012-12-18 ENCOUNTER — Ambulatory Visit: Payer: Medicare Other | Admitting: Internal Medicine

## 2012-12-19 ENCOUNTER — Telehealth: Payer: Self-pay | Admitting: Internal Medicine

## 2012-12-19 NOTE — Telephone Encounter (Signed)
Result Note     Call patient : Studies are unremarkable, no change in recs   I spoke with patient about results and she verbalized understanding and had no questions

## 2012-12-22 ENCOUNTER — Ambulatory Visit: Payer: Medicare Other | Admitting: Internal Medicine

## 2013-02-05 ENCOUNTER — Encounter: Payer: Self-pay | Admitting: Internal Medicine

## 2013-02-05 ENCOUNTER — Ambulatory Visit (INDEPENDENT_AMBULATORY_CARE_PROVIDER_SITE_OTHER): Payer: Medicare Other | Admitting: Internal Medicine

## 2013-02-05 ENCOUNTER — Telehealth: Payer: Self-pay | Admitting: Internal Medicine

## 2013-02-05 VITALS — BP 144/92 | HR 98 | Temp 97.6°F | Ht 62.0 in | Wt 104.0 lb

## 2013-02-05 DIAGNOSIS — J8410 Pulmonary fibrosis, unspecified: Secondary | ICD-10-CM

## 2013-02-05 LAB — PULMONARY FUNCTION TEST

## 2013-02-05 MED ORDER — CLONIDINE HCL 0.1 MG PO TABS: 0.1000 mg | ORAL_TABLET | Freq: Two times a day (BID) | ORAL | Status: DC

## 2013-02-05 NOTE — Patient Instructions (Addendum)
Stop tenex  Start clonidine 0.1 mg twice daily in place of tenex (ok to break am dose in half if too sleepy)   Defer dosing of prednisone to Dr  Nickola Major note before restarted it you lab result was: Lab Results  Component Value Date   ESRSEDRATE 51* 12/17/2012     Please schedule a follow up visit in 3 months but call sooner if needed

## 2013-02-05 NOTE — Telephone Encounter (Signed)
Yes, it replaces tenex which she has tolerated fine

## 2013-02-05 NOTE — Assessment & Plan Note (Signed)
-   onset Oct 2011 assoc with rash       - CT Chest 05/09/11 Stable chronic interstitial opacities in the lung bases most  compatible with fibrosis.          Mildly prominent bilateral axillary lymph nodes, left slightly  greater than right. These are stable          - 05/24/11 Walked 3 laps @ 185 ft each stopped due to  End of test sats down to 90 RA   but no symptoms       - 07/05/2011 Walked 2 laps @ 185 ft each stopped due to desat to 86% RA at rapid pace, no symptoms       - PFT's 07/05/2011 VC   1.75   74% ratio 89,  DLCO 49% corrects to 84        - PFT's 02/05/2013  VC   1.94  75% ratio 83 and DLCO 64% corrects 85        - Methotrexate rx 07/2011>>> 12/17/2012 ? mtx toxicity       -07/04/2012  Walked RA x 3 laps @ 185 ft each stopped due to  End of study, sats ok      - 12/17/2012  Walked RA  2 laps @ 185 ft each stopped due to  desat to 75%> restarted pred and d/c mtx      - 02/05/2013  Walked RA  2 laps @ 185 ft each stopped due to  Sat 89%       - 12/17/2012  ESR 51  Clearly has improved her ex tol/ ex sats suggesting steroid responsive ILD more likely related to mtx than RA but always tough to know as we stopped the mtx when the prednisone was restarted but the absence of arthritis flare when the breathing worsened point more to the former than the latter in the ddx  For now rec maintain at present dose of 5 mg bid and see Dr Nickola Major for maint rx.

## 2013-02-05 NOTE — Telephone Encounter (Signed)
Pharmacist states the Clonidine prescribed today may interact with the Atenolol causing "life threatening hypotension".  MW, pls advise if pt should still take both medications.

## 2013-02-05 NOTE — Progress Notes (Signed)
PFT done today. 

## 2013-02-05 NOTE — Telephone Encounter (Signed)
Pharmacist aware that it is fine for pt to take Atenolol and Clonidine per Dr. Sherene Sires.

## 2013-02-05 NOTE — Progress Notes (Signed)
Subjective:     Patient ID: Michelle Bonilla, female   DOB: 1941/06/12    MRN: [id number]   Brief patient profile:  35 yobf never smoked perfect health x hpb/ thyroid until Oct 2011 developed rash over chest some better with prednisone then hand swelling p stopped steroids  eval by Michelle Bonilla dx connective tissue dz so restarted on prednisone 04/2011 and referred to Pulmonary clinic by Dr Michelle Bonilla 05/2011 for ILD by CT   05/24/2011 Initial pulmonary office eval in EMR era  For ILD with no limiting sob or cough and good control of arthritis on prednisone at 5 mg three times a day.  Feels joint problems are much better. No unusual exposure hx or hx of taking chemo, amiodarone or macrodantin. rec return for pft's/cxr   07/05/2011 ov/Michelle Bonilla cc no significant limiting sob or cough, no flare of arthritis since on prednisone but considering mtx per Dr Michelle Bonilla.   - PFT's 07/05/2011 VC   1.75   74% ratio 89,  DLCO 49% corrects to 84   rec  Methotrexate's benefit is worth the risk to reduce prednisone need but does carry the risk of worsening your breathing and if this happens you first notice it on exertion and should call me     08/22/2011 f/u ov/Michelle Bonilla cc less sob, able to walk around a track x 45 minutes at a time s stopping and arthritis is improving. No cough rec gerd diet Monitor ability to walk track   04/02/2012 f/u ov/Michelle Bonilla cc no change doe, not doing track due to weather but plans to start again, "not coughing" but clearing throat a lot since started on acei, attibutes this to "sinus drainage" but didn't have it before started acei. Arthritis good shape rec Please schedule a follow up visit in 3 months but call sooner if needed - consider d/c acei > done.   07/04/2012 f/u ov/Michelle Bonilla cc back to normal walking and throat problem completely cleared off acei, arthritis doing fine off prednisone for months s flare. rec  f/u prn   12/17/2012 f/u ov/Michelle Bonilla cc indolent onset progressive sense she's loosing ground with  activity tol, fatigue since off prednisone and sev months dry cough despite off acei and on ppi daily.  Arthritis well controlled as mtx titrated up rec Continue protonix 40 mg Take 30-60 min before first meal of the day  Prednisone 10 mg twice daily with meals and once better, one half twice daily   02/05/2013 f/u ov/Michelle Bonilla cc all smiles, overall much better cough gone, energy is main limiting problems, not sob, and no arthritis flare off mtx  No purulent sputum or sinus/hb symptoms on present rx. No obvious daytime variabilty or   cp or chest tightness, subjective wheeze. No unusual exp hx     Sleeping ok without nocturnal  or early am exac of resp c/o's. Also denies any obvious fluctuation of symptoms with weather or environmental changes or other aggravating or alleviating factors.    ROS  The following are not active complaints unless bolded sore throat, dysphagia, dental problems, itching, sneezing,  nasal congestion or excess/ purulent secretions, ear ache,   fever, chills, sweats, unintended wt loss, pleuritic or exertional cp, hemoptysis,  orthopnea pnd or leg swelling, presyncope, palpitations, heartburn, abdominal pain, anorexia, nausea, vomiting, diarrhea  or change in bowel or urinary habits, change in stools or urine, dysuria,hematuria,  rash, arthralgias, visual complaints, headache, numbness weakness or ataxia or problems with walking or coordination,  change in mood/affect or memory.        [name]  PMHx Pulmonary Fibrosis/ Connective Tissue dz................Marland KitchenMarland KitchenZenovia Bonilla       - onset Oct 2011 assoc with rash       - Prednisone started 05/2011        - CT Chest 05/09/11 Stable chronic interstitial opacities in the lung bases most  compatible with fibrosis.          Mildly prominent bilateral axillary lymph nodes, left slightly  greater than right. These are stable           Objective:   Physical Exam  Pleasant amb bf nad  Mildly cushingnoid facies  Wt 102 05/24/11 >   114 07/05/11  > 121 08/22/2011  > 04/02/2012  109 > 07/04/2012  102 > 93 12/17/2012 > 104 02/05/2013   HEENT: nl dentition, turbinates, and orophanx. Nl external ear canals without cough reflex   NECK :  without JVD/Nodes/TM/ nl carotid upstrokes bilaterally   LUNGS: no acc muscle use, clear to A and P bilaterally without cough on insp or exp maneuvers. No sign crackles/minimal BV changes both bases   CV:  RRR  no s3 or murmur,   No increase in P2, no edema   ABD:  soft and nontender with nl excursion in the supine position. No bruits or organomegaly, bowel sounds nl  MS:  warm without deformities, calf tenderness, cyanosis.  No clubbing  SKIN: warm and dry without lesions / nodules     Ct 12/08/12 Progressive pulmonary fibrosis and bronchiectasis. No pulmonary  masses. The new densities on chest x-ray represent focal areas of  fibrosis and secondary inflammation.    Assessment:         Plan:

## 2013-02-18 ENCOUNTER — Encounter: Payer: Self-pay | Admitting: Internal Medicine

## 2013-03-04 ENCOUNTER — Encounter (HOSPITAL_COMMUNITY): Payer: Self-pay | Admitting: Emergency Medicine

## 2013-03-04 ENCOUNTER — Inpatient Hospital Stay (HOSPITAL_COMMUNITY)
Admission: EM | Admit: 2013-03-04 | Discharge: 2013-03-06 | DRG: 391 | Disposition: A | Discharge status: Home / Self Care | Payer: Medicare Other | Attending: Internal Medicine | Admitting: Internal Medicine

## 2013-03-04 ENCOUNTER — Inpatient Hospital Stay (HOSPITAL_COMMUNITY): Payer: Medicare Other

## 2013-03-04 ENCOUNTER — Other Ambulatory Visit: Payer: Self-pay

## 2013-03-04 ENCOUNTER — Emergency Department (HOSPITAL_COMMUNITY): Payer: Medicare Other

## 2013-03-04 DIAGNOSIS — IMO0002 Reserved for concepts with insufficient information to code with codable children: Secondary | ICD-10-CM

## 2013-03-04 DIAGNOSIS — R6510 Systemic inflammatory response syndrome (SIRS) of non-infectious origin without acute organ dysfunction: Secondary | ICD-10-CM | POA: Diagnosis present

## 2013-03-04 DIAGNOSIS — M064 Inflammatory polyarthropathy: Secondary | ICD-10-CM | POA: Diagnosis present

## 2013-03-04 DIAGNOSIS — E039 Hypothyroidism, unspecified: Secondary | ICD-10-CM | POA: Diagnosis present

## 2013-03-04 DIAGNOSIS — Z8711 Personal history of peptic ulcer disease: Secondary | ICD-10-CM

## 2013-03-04 DIAGNOSIS — G9340 Encephalopathy, unspecified: Secondary | ICD-10-CM | POA: Diagnosis present

## 2013-03-04 DIAGNOSIS — A088 Other specified intestinal infections: Principal | ICD-10-CM | POA: Diagnosis present

## 2013-03-04 DIAGNOSIS — I498 Other specified cardiac arrhythmias: Secondary | ICD-10-CM | POA: Diagnosis present

## 2013-03-04 DIAGNOSIS — J8410 Pulmonary fibrosis, unspecified: Secondary | ICD-10-CM | POA: Diagnosis present

## 2013-03-04 DIAGNOSIS — G9349 Other encephalopathy: Secondary | ICD-10-CM | POA: Diagnosis present

## 2013-03-04 DIAGNOSIS — J479 Bronchiectasis, uncomplicated: Secondary | ICD-10-CM | POA: Diagnosis present

## 2013-03-04 DIAGNOSIS — K529 Noninfective gastroenteritis and colitis, unspecified: Secondary | ICD-10-CM | POA: Diagnosis present

## 2013-03-04 DIAGNOSIS — A419 Sepsis, unspecified organism: Secondary | ICD-10-CM | POA: Diagnosis present

## 2013-03-04 DIAGNOSIS — R000 Tachycardia, unspecified: Secondary | ICD-10-CM | POA: Diagnosis present

## 2013-03-04 DIAGNOSIS — Z79899 Other long term (current) drug therapy: Secondary | ICD-10-CM

## 2013-03-04 DIAGNOSIS — R509 Fever, unspecified: Secondary | ICD-10-CM

## 2013-03-04 DIAGNOSIS — R4182 Altered mental status, unspecified: Secondary | ICD-10-CM

## 2013-03-04 DIAGNOSIS — I10 Essential (primary) hypertension: Secondary | ICD-10-CM

## 2013-03-04 LAB — CSF CELL COUNT WITH DIFFERENTIAL: Tube #: 3

## 2013-03-04 LAB — CBC WITH DIFFERENTIAL/PLATELET
Basophils Absolute: 0 10*3/uL (ref 0.0–0.1)
HCT: 46.7 % — ABNORMAL HIGH (ref 36.0–46.0)
Lymphocytes Relative: 6 % — ABNORMAL LOW (ref 12–46)
Lymphs Abs: 0.9 10*3/uL (ref 0.7–4.0)
MCV: 90 fL (ref 78.0–100.0)
Monocytes Absolute: 0.9 10*3/uL (ref 0.1–1.0)
Neutro Abs: 12.3 10*3/uL — ABNORMAL HIGH (ref 1.7–7.7)
RBC: 5.19 MIL/uL — ABNORMAL HIGH (ref 3.87–5.11)
RDW: 13 % (ref 11.5–15.5)
WBC: 14.1 10*3/uL — ABNORMAL HIGH (ref 4.0–10.5)

## 2013-03-04 LAB — CBC
MCV: 87.5 fL (ref 78.0–100.0)
Platelets: 231 10*3/uL (ref 150–400)
RBC: 4.33 MIL/uL (ref 3.87–5.11)
RDW: 13.3 % (ref 11.5–15.5)
WBC: 8.3 10*3/uL (ref 4.0–10.5)

## 2013-03-04 LAB — COMPREHENSIVE METABOLIC PANEL
ALT: 8 U/L (ref 0–35)
AST: 14 U/L (ref 0–37)
CO2: 30 mEq/L (ref 19–32)
Chloride: 98 mEq/L (ref 96–112)
GFR calc Af Amer: 59 mL/min — ABNORMAL LOW (ref >90)
GFR calc non Af Amer: 51 mL/min — ABNORMAL LOW (ref >90)
Glucose, Bld: 123 mg/dL — ABNORMAL HIGH (ref 70–99)
Sodium: 142 mEq/L (ref 135–145)
Total Bilirubin: 0.3 mg/dL (ref 0.3–1.2)

## 2013-03-04 LAB — GRAM STAIN

## 2013-03-04 LAB — TROPONIN I: Troponin I: <0.3 ng/mL (ref <0.30)

## 2013-03-04 LAB — POCT I-STAT TROPONIN I

## 2013-03-04 LAB — URINALYSIS, ROUTINE W REFLEX MICROSCOPIC
Bilirubin Urine: NEGATIVE
Glucose, UA: NEGATIVE mg/dL
Hgb urine dipstick: NEGATIVE
Specific Gravity, Urine: 1.021 (ref 1.005–1.030)
pH: 7.5 (ref 5.0–8.0)

## 2013-03-04 LAB — PROTEIN AND GLUCOSE, CSF
Glucose, CSF: 61 mg/dL (ref 43–76)
Total  Protein, CSF: 53 mg/dL — ABNORMAL HIGH (ref 15–45)

## 2013-03-04 MED ORDER — ACETAMINOPHEN 650 MG RE SUPP: 650.0000 mg | Freq: Four times a day (QID) | RECTAL | Status: DC | PRN

## 2013-03-04 MED ORDER — SODIUM CHLORIDE 0.9 % IV SOLN
Freq: Once | INTRAVENOUS | Status: AC
  Administered 2013-03-04: 12:00:00 via INTRAVENOUS

## 2013-03-04 MED ORDER — VANCOMYCIN HCL IN DEXTROSE 1-5 GM/200ML-% IV SOLN
1000.0000 mg | Freq: Once | INTRAVENOUS | Status: AC
  Administered 2013-03-04: 1000 mg via INTRAVENOUS
  Filled 2013-03-04: qty 200

## 2013-03-04 MED ORDER — LEVALBUTEROL HCL 0.63 MG/3ML IN NEBU
0.6300 mg | INHALATION_SOLUTION | Freq: Four times a day (QID) | RESPIRATORY_TRACT | Status: DC | PRN
  Filled 2013-03-04: qty 3

## 2013-03-04 MED ORDER — ACETAMINOPHEN 650 MG RE SUPP
650.0000 mg | Freq: Once | RECTAL | Status: AC
  Administered 2013-03-04: 650 mg via RECTAL
  Filled 2013-03-04: qty 1

## 2013-03-04 MED ORDER — DEXTROSE 5 % IV SOLN
10.0000 mg/kg | Freq: Two times a day (BID) | INTRAVENOUS | Status: DC
  Administered 2013-03-05: 500 mg via INTRAVENOUS
  Filled 2013-03-04 (×2): qty 10

## 2013-03-04 MED ORDER — PANTOPRAZOLE SODIUM 40 MG IV SOLR
40.0000 mg | INTRAVENOUS | Status: DC
  Administered 2013-03-04 – 2013-03-05 (×2): 40 mg via INTRAVENOUS
  Filled 2013-03-04 (×3): qty 40

## 2013-03-04 MED ORDER — PREDNISONE 5 MG PO TABS
5.0000 mg | ORAL_TABLET | Freq: Every day | ORAL | Status: DC
  Administered 2013-03-05 – 2013-03-06 (×2): 5 mg via ORAL
  Filled 2013-03-04 (×4): qty 1

## 2013-03-04 MED ORDER — DEXTROSE 5 % IV SOLN: 1.0000 g | INTRAVENOUS | Status: DC

## 2013-03-04 MED ORDER — SODIUM CHLORIDE 0.9 % IV BOLUS (SEPSIS)
1000.0000 mL | Freq: Once | INTRAVENOUS | Status: AC
  Administered 2013-03-04: 1000 mL via INTRAVENOUS

## 2013-03-04 MED ORDER — SODIUM CHLORIDE 0.9 % IJ SOLN
3.0000 mL | Freq: Two times a day (BID) | INTRAMUSCULAR | Status: DC
  Administered 2013-03-05 – 2013-03-06 (×2): 3 mL via INTRAVENOUS

## 2013-03-04 MED ORDER — SODIUM CHLORIDE 0.9 % IV SOLN
INTRAVENOUS | Status: DC
  Administered 2013-03-04: 22:00:00 via INTRAVENOUS
  Administered 2013-03-05: 20 mL via INTRAVENOUS

## 2013-03-04 MED ORDER — ONDANSETRON HCL 4 MG/2ML IJ SOLN
4.0000 mg | Freq: Once | INTRAMUSCULAR | Status: AC
  Administered 2013-03-04: 4 mg via INTRAVENOUS
  Filled 2013-03-04: qty 2

## 2013-03-04 MED ORDER — ACETAMINOPHEN 325 MG PO TABS
650.0000 mg | ORAL_TABLET | Freq: Four times a day (QID) | ORAL | Status: DC | PRN
  Administered 2013-03-05: 650 mg via ORAL
  Filled 2013-03-04: qty 2

## 2013-03-04 MED ORDER — ENOXAPARIN SODIUM 40 MG/0.4ML ~~LOC~~ SOLN
40.0000 mg | SUBCUTANEOUS | Status: DC
  Administered 2013-03-04 – 2013-03-05 (×2): 40 mg via SUBCUTANEOUS
  Filled 2013-03-04 (×3): qty 0.4

## 2013-03-04 MED ORDER — DEXTROSE 5 % IV SOLN
10.0000 mg/kg | INTRAVENOUS | Status: AC
  Administered 2013-03-04: 500 mg via INTRAVENOUS
  Filled 2013-03-04: qty 10

## 2013-03-04 MED ORDER — ONDANSETRON HCL 4 MG PO TABS: 4.0000 mg | ORAL_TABLET | Freq: Four times a day (QID) | ORAL | Status: DC | PRN

## 2013-03-04 MED ORDER — IOHEXOL 350 MG/ML SOLN
100.0000 mL | Freq: Once | INTRAVENOUS | Status: AC | PRN
  Administered 2013-03-04: 100 mL via INTRAVENOUS

## 2013-03-04 MED ORDER — DEXTROSE 5 % IV SOLN
2.0000 g | Freq: Once | INTRAVENOUS | Status: AC
  Administered 2013-03-04: 2 g via INTRAVENOUS
  Filled 2013-03-04: qty 2

## 2013-03-04 MED ORDER — ATENOLOL 50 MG PO TABS
50.0000 mg | ORAL_TABLET | Freq: Two times a day (BID) | ORAL | Status: DC
  Administered 2013-03-04 – 2013-03-06 (×4): 50 mg via ORAL
  Filled 2013-03-04 (×6): qty 1

## 2013-03-04 MED ORDER — DEXTROSE 5 % IV SOLN
2.0000 g | Freq: Two times a day (BID) | INTRAVENOUS | Status: DC
  Administered 2013-03-05: 2 g via INTRAVENOUS
  Filled 2013-03-04 (×2): qty 2

## 2013-03-04 MED ORDER — MIDAZOLAM HCL 2 MG/2ML IJ SOLN
2.0000 mg | Freq: Once | INTRAMUSCULAR | Status: AC
  Administered 2013-03-04: 2 mg via INTRAVENOUS
  Filled 2013-03-04: qty 2

## 2013-03-04 MED ORDER — ONDANSETRON HCL 4 MG/2ML IJ SOLN: 4.0000 mg | Freq: Four times a day (QID) | INTRAMUSCULAR | Status: DC | PRN

## 2013-03-04 MED ORDER — ACETAMINOPHEN 325 MG PO TABS: 650.0000 mg | ORAL_TABLET | Freq: Once | ORAL | Status: DC

## 2013-03-04 NOTE — ED Notes (Signed)
Pt tolerated well. Unable to obtain LP. Pt to go to IR

## 2013-03-04 NOTE — ED Provider Notes (Addendum)
History     CSN: [id number]  Arrival date & time 03/04/13  4010   First MD Initiated Contact with Patient 03/04/13 1020      Chief Complaint  Patient presents with  . Shortness of Breath    (Consider location/radiation/quality/duration/timing/severity/associated sxs/prior treatment) HPI Comments: 72 year old female with a history of progressive pulmonary fibrosis and bronchiectasis, on chronic prednisone, followed by Dr. Sandrea Hughs, who presents to the ER with a fever and altered mental status, and tachycardia. According to the daughter, patient was noted to be confused this morning, before her visit to the PCP.  At the PCP office, she was also found to be somewhat hypoxic and short of breath and was sent to the ER to rule out pulmonary embolism.  Pt denies nausea, emesis, fevers, chills, chest pains, shortness of breath, headaches, neck stiffness, abdominal pain, uti like symptoms. Pt did develop a fever during the ED visit. No hx of strokes, no hx of similar symptoms in the past.   The history is provided by the patient, a relative and medical records.    Past Medical History  Diagnosis Date  . Hypertension   . Hypothyroid 03/06/2011    per patient  . PUD (peptic ulcer disease)     Dr Renae Fickle  . Connective tissue disease   . Inflammatory arthritis     History reviewed. No pertinent past surgical history.  Family History  Problem Relation Age of Onset  . Breast cancer Sister   . Bone cancer Sister     History  Substance Use Topics  . Smoking status: Never Smoker   . Smokeless tobacco: Never Used  . Alcohol Use: No    OB History   Grav Para Term Preterm Abortions TAB SAB Ect Mult Living                  Review of Systems  Unable to perform ROS: Mental status change  Constitutional: Positive for fever and activity change.  HENT: Negative for neck pain.   Respiratory: Negative for chest tightness and shortness of breath.   Cardiovascular: Negative for chest pain  and palpitations.  Gastrointestinal: Negative for nausea, vomiting and abdominal pain.  Genitourinary: Negative for dysuria, hematuria and flank pain.  Neurological: Negative for headaches.    Allergies  Review of patient's allergies indicates no known allergies.  Home Medications   Current Outpatient Rx  Name  Route  Sig  Dispense  Refill  . atenolol (TENORMIN) 50 MG tablet   Oral   Take 50 mg by mouth 2 (two) times daily.          . cloNIDine (CATAPRES) 0.1 MG tablet   Oral   Take 1 tablet (0.1 mg total) by mouth 2 (two) times daily.   60 tablet   11   . gemfibrozil (LOPID) 600 MG tablet   Oral   Take 600 mg by mouth 2 (two) times daily.          [name] levothyroxine (SYNTHROID, LEVOTHROID) 75 MCG tablet   Oral   Take 75 mcg by mouth daily.         . pantoprazole (PROTONIX) 40 MG tablet   Oral   Take 40 mg by mouth daily before breakfast.          . prednisoLONE 5 MG TABS   Oral   Take 5-10 mg by mouth 2 (two) times daily as needed (for cough).           BP 129/66  Pulse 116  Temp(Src) 101.2 F (38.4 C) (Oral)  Resp 20  Ht 5\' 2"  (1.575 m)  Wt 110 lb (49.896 kg)  BMI 20.11 kg/m2  SpO2 97%  Physical Exam  Nursing note and vitals reviewed. Constitutional: She appears well-developed and well-nourished.  HENT:  Head: Normocephalic and atraumatic.  Eyes: EOM are normal. Pupils are equal, round, and reactive to light.  Neck: Neck supple.  Cardiovascular: Normal rate, regular rhythm and normal heart sounds.   No murmur heard. Pulmonary/Chest: Effort normal. No respiratory distress.  Abdominal: Soft. She exhibits no distension. There is no tenderness. There is no rebound and no guarding.  Neurological: She is alert.  confused  Skin: Skin is warm and dry.    ED Course  LUMBAR PUNCTURE Date/Time: 03/04/2013 4:00 PM Performed by: Derwood Kaplan Authorized by: Derwood Kaplan Consent: written consent obtained. Risks and benefits: risks, benefits and  alternatives were discussed Consent given by: patient Patient understanding: patient states understanding of the procedure being performed Patient consent: the patient's understanding of the procedure matches consent given Procedure consent: procedure consent matches procedure scheduled Relevant documents: relevant documents present and verified Test results: test results available and properly labeled Site marked: the operative site was marked Imaging studies: imaging studies available Patient identity confirmed: verbally with patient Time out: Immediately prior to procedure a "time out" was called to verify the correct patient, procedure, equipment, support staff and site/side marked as required. Indications: evaluation for altered mental status Anesthesia: local infiltration Local anesthetic: lidocaine 1% with epinephrine Anesthetic total: 4 ml Patient sedated: yes Sedation type: anxiolysis Sedatives: midazolam Sedation start date/time: 03/04/2013 4:00 PM Sedation end date/time: 03/04/2013 4:30 PM Vitals: Vital signs were monitored during sedation. Preparation: Patient was prepped and draped in the usual sterile fashion. Lumbar space: L4-L5 interspace Patient's position: sitting Needle gauge: 18 Number of attempts: 3 Post-procedure: site cleaned, pressure dressing applied and adhesive bandage applied Patient tolerance: Patient tolerated the procedure well with no immediate complications. Comments: Failed with attempts in the ED. Patient to get the LP completed by IR.   (including critical care time)  Labs Reviewed  CBC WITH DIFFERENTIAL - Abnormal; Notable for the following:    WBC 14.1 (*)    RBC 5.19 (*)    Hemoglobin 17.0 (*)    HCT 46.7 (*)    MCHC 36.4 (*)    Neutrophils Relative 88 (*)    Neutro Abs 12.3 (*)    Lymphocytes Relative 6 (*)    All other components within normal limits  COMPREHENSIVE METABOLIC PANEL - Abnormal; Notable for the following:    Glucose, Bld  123 (*)    Total Protein 8.5 (*)    GFR calc non Af Amer 51 (*)    GFR calc Af Amer 59 (*)    All other components within normal limits  PRO B NATRIURETIC PEPTIDE - Abnormal; Notable for the following:    Pro B Natriuretic peptide (BNP) 1361.0 (*)    All other components within normal limits  LACTIC ACID, PLASMA - Abnormal; Notable for the following:    Lactic Acid, Venous 3.0 (*)    All other components within normal limits  URINE CULTURE  CULTURE, BLOOD (ROUTINE X 2)  CULTURE, BLOOD (ROUTINE X 2)  CSF CULTURE  MAGNESIUM  URINALYSIS, ROUTINE W REFLEX MICROSCOPIC  INFLUENZA PANEL BY PCR  CSF CELL COUNT WITH DIFFERENTIAL  PROTEIN AND GLUCOSE, CSF  HERPES SIMPLEX VIRUS(HSV) DNA BY PCR  POCT I-STAT TROPONIN I   Dg  Chest 1 View  03/04/2013  *RADIOLOGY REPORT*  Clinical Data: Shortness of breath  CHEST - 1 VIEW  Comparison: 12/04/2012  Findings: Normal heart size and mediastinal contours. Minimal pulmonary vascular congestion. Bibasilar atelectasis and fibrosis unchanged. Perihilar bronchitic changes. No definite acute infiltrate, pleural effusion or pneumothorax. 9 x 5 mm diameter left upper lobe nodular density is better defined on the previous exam. Bones appear diffusely demineralized.  IMPRESSION: Bibasilar atelectasis and fibrosis. Questionable nodular density left upper lobe 9 x 5 mm; please refer to CT chest exam of 03/04/2013.   Original Report Authenticated By: Ulyses Southward, M.D.    Ct Head Wo Contrast  03/04/2013  *RADIOLOGY REPORT*  Clinical Data: Altered mental status  CT HEAD WITHOUT CONTRAST  Technique:  Contiguous axial images were obtained from the base of the skull through the vertex without contrast.  Comparison: None.  Findings: Motion degraded images.  No evidence of parenchymal hemorrhage or extra-axial fluid collection. No mass lesion, mass effect, or midline shift.  No CT evidence of acute infarction.  Subcortical white matter and periventricular small vessel ischemic  changes.  The visualized paranasal sinuses are essentially clear. The mastoid air cells are unopacified.  No evidence of calvarial fracture.  IMPRESSION: Motion degraded images.  No evidence of acute intracranial abnormality.  Small vessel ischemic changes.   Original Report Authenticated By: Charline Bills, M.D.    Ct Angio Chest Pe W/cm &/or Wo Cm  03/04/2013  *RADIOLOGY REPORT*  Clinical Data: Chest pain, shortness of breath, altered mental status.  CT ANGIOGRAPHY CHEST  Technique:  Multidetector CT imaging of the chest using the standard protocol during bolus administration of intravenous contrast. Multiplanar reconstructed images including MIPs were obtained and reviewed to evaluate the vascular anatomy.  Contrast: OMNIPAQUE IOHEXOL 350 MG/ML SOLN  Comparison: Chest x-ray performed today.  Prior CT 12/08/2012  Findings: No filling defects in the pulmonary arteries to suggest pulmonary emboli.  Areas of peripheral subpleural septal thickening.  Extensive septal thickening in the lung bases bilaterally with ground-glass opacities, likely fibrosis.  This is unchanged.  No acute airspace opacities.  Small scattered mediastinal lymph nodes, likely reactive, stable since prior study. Heart is normal size. Aorta is normal caliber. Visualized thyroid and chest wall soft tissues unremarkable.  Small hiatal hernia present.  Imaging into the upper abdomen shows no acute findings.  IMPRESSION: No acute bony abnormality.  Changes of fibrosis in the lungs, stable.  No evidence of pulmonary embolus.  No acute findings.  Small hiatal hernia.   Original Report Authenticated By: Charlett Nose, M.D.      No diagnosis found.    MDM   Date: 03/04/2013  Rate: 130  Rhythm: sinus tachycardia  QRS Axis: normal  Intervals: normal  ST/T Wave abnormalities: nonspecific ST/T changes  Conduction Disutrbances:none  Narrative Interpretation:   Old EKG Reviewed: unchanged    Derwood Kaplan, MD 03/04/13  1735  DDx: Sepsis syndrome ACS syndrome DKA ICH Stroke CHF exacerbation COPD exacerbation Infection - pneumonia/UTI/Cellulitis/meningitis Encephalopathy PE Dehydration Electrolyte abnormality Tox syndrome  Pt comes in with tachycardia, some hypoxia and AMS. Consideration includes encephalopathy, TIAs. Pt developed a fever while in the ED, and infectious etiology was favored. LP was attempted in the ED, but we failed, and IR was to complete it. AB started empirically as presumed meningitis. PE workup deferred, as it was deemed less likely in the acute setting.  Derwood Kaplan, MD 03/24/13 [phone]

## 2013-03-04 NOTE — ED Notes (Addendum)
Seen at [facility] today sent to ED for evaluation of altered mental status, emesis black, and rule out PE.  Onset last night 11pm when daughter spoke with patient on phone.

## 2013-03-04 NOTE — ED Notes (Signed)
Pt to IR for LP

## 2013-03-04 NOTE — ED Notes (Signed)
ED-P and RN in room for LP.

## 2013-03-04 NOTE — ED Notes (Signed)
Pt returned from IR. Family at bedside

## 2013-03-04 NOTE — Procedures (Signed)
LP performed at L4-5 after informed consent. No complication. Full note in radiology reporting system.

## 2013-03-04 NOTE — Progress Notes (Signed)
ANTIBIOTIC CONSULT NOTE - INITIAL  Pharmacy Consult for Rocephin, Acyclovir Indication: Meningitis, Encephalitis  No Known Allergies  Patient Measurements: Height: 5\' 2"  (157.5 cm) Weight: 110 lb (49.896 kg) IBW/kg (Calculated) : 50.1 Adjusted Body Weight: 50 kg  Vital Signs: Temp: 101.2 F (38.4 C) (03/05 1330) Temp src: Oral (03/05 1009) BP: 144/85 mmHg (03/05 1326) Pulse Rate: 121 (03/05 1330) Intake/Output from previous day:   Intake/Output from this shift:    Labs:  Recent Labs  03/04/13 1024  WBC 14.1*  HGB 17.0*  PLT 319  CREATININE 1.08   Estimated Creatinine Clearance: 38.2 ml/min (by C-G formula based on Cr of 1.08). No results found for this basename: VANCOTROUGH, VANCOPEAK, VANCORANDOM, GENTTROUGH, GENTPEAK, GENTRANDOM, TOBRATROUGH, TOBRAPEAK, TOBRARND, AMIKACINPEAK, AMIKACINTROU, AMIKACIN,  in the last 72 hours   Microbiology: No results found for this or any previous visit (from the past 720 hour(s)).  Medical History: Past Medical History  Diagnosis Date  . Hypertension   . Hypothyroid 03/06/2011    per patient  . PUD (peptic ulcer disease)     Dr Renae Fickle  . Connective tissue disease   . Inflammatory arthritis     Medications: Atenolol, Clonidine, Gemfibrozil, Sythroid, Protonix, Prednisolone.  Assessment: AMS, Black Emesis, fever, hypoxia, SOB Scr 1.08 with estimated  CrCl 38. WBC elevated 14.1.  PMH:  progressive pulmonary fibrosis and bronchiectasis, on chronic prednisone, HTN, hypothyroid, PUD, connective tissue dz, arthritis  Goal of Therapy:  CSF penetration  Plan:  Lumbar puncture Rocephin 2g IV q12 hrs. No renal adjustment required. Acyclovir 10mg /kg/12h (for CrCl 30-50) = 500MG  Q12H Vanco 1g IV x 1 in ER.  Merilynn Finland, [employer] 03/04/2013,2:16 PM

## 2013-03-04 NOTE — ED Notes (Signed)
EDP in room at this time 

## 2013-03-04 NOTE — Progress Notes (Signed)
Patient's heartrate is noted in 111-119.NP Schorr made aware. RN will continue to monitor. Louretta Parma, RN

## 2013-03-04 NOTE — H&P (Signed)
[facility] History and Physical  Michelle Bonilla ZOX:[id number] DOB: [birth year] DOA: 03/04/2013  Referring physician: ER  PCP: Elby Showers, MD   Chief Complaint: Altered mental status  HPI:  72 year old female with a history of progressive pulmonary fibrosis and bronchiectasis, on chronic prednisone, followed by Dr. Sandrea Hughs, who presents to the ER with a fever and altered mental status. Most of the history is obtained from the ER notes and ER physician. Patient was documented to have a fever greater than 101. She was also found to be somewhat hypoxic and short of breath and was sent to the ER to rule out occult embolism. The symptoms have been present since last night.      Review of Systems: negative for the following  Constitutional: Denies fever, chills, diaphoresis, appetite change and fatigue.  HEENT: Denies photophobia, eye pain, redness, hearing loss, ear pain, congestion, sore throat, rhinorrhea, sneezing, mouth sores, trouble swallowing, neck pain, neck stiffness and tinnitus.  Respiratory: Denies SOB, DOE, cough, chest tightness, and wheezing.  Cardiovascular: Denies chest pain, palpitations and leg swelling.  Gastrointestinal: Denies nausea, vomiting, abdominal pain, diarrhea, constipation, blood in stool and abdominal distention.  Genitourinary: Denies dysuria, urgency, frequency, hematuria, flank pain and difficulty urinating.  Musculoskeletal: Denies myalgias, back pain, joint swelling, arthralgias and gait problem.  Skin: Denies pallor, rash and wound.  Neurological: Denies dizziness, seizures, syncope, weakness, light-headedness, numbness and headaches.  Hematological: Denies adenopathy. Easy bruising, personal or family bleeding history  Psychiatric/Behavioral: Denies suicidal ideation, mood changes, confusion, nervousness, sleep disturbance and agitation       Past Medical History  Diagnosis Date  . Hypertension   . Hypothyroid 03/06/2011    per  patient  . PUD (peptic ulcer disease)     Dr Renae Fickle  . Connective tissue disease   . Inflammatory arthritis      History reviewed. No pertinent past surgical history.    Social History:  reports that she has never smoked. She has never used smokeless tobacco. She reports that she does not drink alcohol or use illicit drugs.    No Known Allergies  Family History  Problem Relation Age of Onset  . Breast cancer Sister   . Bone cancer Sister      Prior to Admission medications   Medication Sig Start Date End Date Taking? Authorizing Provider  atenolol (TENORMIN) 50 MG tablet Take 50 mg by mouth 2 (two) times daily.    Yes Historical Provider, MD  cloNIDine (CATAPRES) 0.1 MG tablet Take 1 tablet (0.1 mg total) by mouth 2 (two) times daily. 02/05/13  Yes Nyoka Cowden, MD  gemfibrozil (LOPID) 600 MG tablet Take 600 mg by mouth 2 (two) times daily.  01/14/13  Yes Historical Provider, MD  levothyroxine (SYNTHROID, LEVOTHROID) 75 MCG tablet Take 75 mcg by mouth daily.   Yes Historical Provider, MD  pantoprazole (PROTONIX) 40 MG tablet Take 40 mg by mouth daily before breakfast.    Yes Historical Provider, MD  prednisoLONE 5 MG TABS Take 5-10 mg by mouth 2 (two) times daily as needed (for cough).   Yes Historical Provider, MD     Physical Exam: Filed Vitals:   03/04/13 1009 03/04/13 1326 03/04/13 1330 03/04/13 1330  BP: 115/73 144/85    Pulse: 130  121   Temp: 98.1 F (36.7 C)   101.2 F (38.4 C)  TempSrc: Oral     Resp: 20  23   Height: 5\' 2"  (1.575 m)     Weight: 49.896  kg (110 lb)     SpO2: 97%  97%      Constitutional: Vital signs reviewed. Patient is a well-developed and well-nourished in no acute distress and cooperative with exam. Alert and oriented x3.  Head: Normocephalic and atraumatic  Ear: TM normal bilaterally  Mouth: no erythema or exudates, MMM  Eyes: PERRL, EOMI, conjunctivae normal, No scleral icterus.  Neck: Supple, Trachea midline normal ROM, No JVD,  mass, thyromegaly, or carotid bruit present.  Cardiovascular: RRR, S1 normal, S2 normal, no MRG, pulses symmetric and intact bilaterally  Pulmonary/Chest: CTAB, no wheezes, rales, or rhonchi  Abdominal: Soft. Non-tender, non-distended, bowel sounds are normal, no masses, organomegaly, or guarding present.  GU: no CVA tenderness Musculoskeletal: No joint deformities, erythema, or stiffness, ROM full and no nontender Ext: no edema and no cyanosis, pulses palpable bilaterally (DP and PT)  Hematology: no cervical, inginal, or axillary adenopathy.  Neurological: Oriented to self,, Strenght is normal and symmetric bilaterally, cranial nerve II-XII are grossly intact, no focal motor deficit, sensory intact to light touch bilaterally.  Skin: Warm, dry and intact. No rash, cyanosis, or clubbing.  Psychiatric: Normal mood and affect. speech and behavior is normal. Judgment and thought content normal. Cognition and memory are normal.       Labs on Admission:    Basic Metabolic Panel:  Recent Labs Lab 03/04/13 1024  NA 142  K 3.8  CL 98  CO2 30  GLUCOSE 123*  BUN 17  CREATININE 1.08  CALCIUM 10.5  MG 2.0   Liver Function Tests:  Recent Labs Lab 03/04/13 1024  AST 14  ALT 8  ALKPHOS 88  BILITOT 0.3  PROT 8.5*  ALBUMIN 3.7   No results found for this basename: LIPASE, AMYLASE,  in the last 168 hours No results found for this basename: AMMONIA,  in the last 168 hours CBC:  Recent Labs Lab 03/04/13 1024  WBC 14.1*  NEUTROABS 12.3*  HGB 17.0*  HCT 46.7*  MCV 90.0  PLT 319   Cardiac Enzymes: No results found for this basename: CKTOTAL, CKMB, CKMBINDEX, TROPONINI,  in the last 168 hours  BNP (last 3 results)  Recent Labs  03/04/13 1024  PROBNP 1361.0*      CBG: No results found for this basename: GLUCAP,  in the last 168 hours  Radiological Exams on Admission: Dg Chest 1 View  03/04/2013  *RADIOLOGY REPORT*  Clinical Data: Shortness of breath  CHEST - 1 VIEW   Comparison: 12/04/2012  Findings: Normal heart size and mediastinal contours. Minimal pulmonary vascular congestion. Bibasilar atelectasis and fibrosis unchanged. Perihilar bronchitic changes. No definite acute infiltrate, pleural effusion or pneumothorax. 9 x 5 mm diameter left upper lobe nodular density is better defined on the previous exam. Bones appear diffusely demineralized.  IMPRESSION: Bibasilar atelectasis and fibrosis. Questionable nodular density left upper lobe 9 x 5 mm; please refer to CT chest exam of 03/04/2013.   Original Report Authenticated By: Ulyses Southward, M.D.    Ct Head Wo Contrast  03/04/2013  *RADIOLOGY REPORT*  Clinical Data: Altered mental status  CT HEAD WITHOUT CONTRAST  Technique:  Contiguous axial images were obtained from the base of the skull through the vertex without contrast.  Comparison: None.  Findings: Motion degraded images.  No evidence of parenchymal hemorrhage or extra-axial fluid collection. No mass lesion, mass effect, or midline shift.  No CT evidence of acute infarction.  Subcortical white matter and periventricular small vessel ischemic changes.  The visualized paranasal sinuses are essentially  clear. The mastoid air cells are unopacified.  No evidence of calvarial fracture.  IMPRESSION: Motion degraded images.  No evidence of acute intracranial abnormality.  Small vessel ischemic changes.   Original Report Authenticated By: Charline Bills, M.D.    Ct Angio Chest Pe W/cm &/or Wo Cm  03/04/2013  *RADIOLOGY REPORT*  Clinical Data: Chest pain, shortness of breath, altered mental status.  CT ANGIOGRAPHY CHEST  Technique:  Multidetector CT imaging of the chest using the standard protocol during bolus administration of intravenous contrast. Multiplanar reconstructed images including MIPs were obtained and reviewed to evaluate the vascular anatomy.  Contrast: OMNIPAQUE IOHEXOL 350 MG/ML SOLN  Comparison: Chest x-ray performed today.  Prior CT 12/08/2012  Findings:  No filling defects in the pulmonary arteries to suggest pulmonary emboli.  Areas of peripheral subpleural septal thickening.  Extensive septal thickening in the lung bases bilaterally with ground-glass opacities, likely fibrosis.  This is unchanged.  No acute airspace opacities.  Small scattered mediastinal lymph nodes, likely reactive, stable since prior study. Heart is normal size. Aorta is normal caliber. Visualized thyroid and chest wall soft tissues unremarkable.  Small hiatal hernia present.  Imaging into the upper abdomen shows no acute findings.  IMPRESSION: No acute bony abnormality.  Changes of fibrosis in the lungs, stable.  No evidence of pulmonary embolus.  No acute findings.  Small hiatal hernia.   Original Report Authenticated By: Charlett Nose, M.D.     EKG: Independently reviewed. None  Assessment/Plan Active Problems:   Pulmonary fibrosis   Acute encephalopathy   Fever   1. *Altered mental status-patient is febrile, cannot rule out meningitis without a lumbar puncture, have requested a lumbar puncture in the ED. We'll get CSF cell count, differential, Gram stain, HSV PCR, culture, and empirically start the patient on Rocephin, vancomycin, acyclovir. Urinalysis still pending, check ammonia level, check TSH 2. Fever negative for pulmonary embolism, no  pneumonia 3. Pulmonary fibrosis currently stable  Code Status:   full Family Communication: bedside Disposition Plan: admit   Time spent: 70 mins   [facility] Pager [phone]  If 7PM-7AM, please contact night-coverage [email or web address] Password [facility] 03/04/2013, 2:39 PM

## 2013-03-04 NOTE — ED Notes (Signed)
ABX started per Dr.Nanavati, prior to LP.

## 2013-03-05 ENCOUNTER — Inpatient Hospital Stay (HOSPITAL_COMMUNITY): Payer: Medicare Other

## 2013-03-05 DIAGNOSIS — R6510 Systemic inflammatory response syndrome (SIRS) of non-infectious origin without acute organ dysfunction: Secondary | ICD-10-CM

## 2013-03-05 DIAGNOSIS — K529 Noninfective gastroenteritis and colitis, unspecified: Secondary | ICD-10-CM | POA: Diagnosis present

## 2013-03-05 DIAGNOSIS — I498 Other specified cardiac arrhythmias: Secondary | ICD-10-CM

## 2013-03-05 DIAGNOSIS — G9340 Encephalopathy, unspecified: Secondary | ICD-10-CM

## 2013-03-05 DIAGNOSIS — K5289 Other specified noninfective gastroenteritis and colitis: Secondary | ICD-10-CM

## 2013-03-05 DIAGNOSIS — R000 Tachycardia, unspecified: Secondary | ICD-10-CM | POA: Diagnosis present

## 2013-03-05 LAB — URINE CULTURE
Colony Count: NO GROWTH
Culture: NO GROWTH

## 2013-03-05 LAB — INFLUENZA PANEL BY PCR (TYPE A & B)
Influenza A By PCR: NEGATIVE
Influenza B By PCR: NEGATIVE

## 2013-03-05 LAB — TROPONIN I: Troponin I: <0.3 ng/mL (ref <0.30)

## 2013-03-05 MED ORDER — GADOBENATE DIMEGLUMINE 529 MG/ML IV SOLN
10.0000 mL | Freq: Once | INTRAVENOUS | Status: AC
  Administered 2013-03-05: 10 mL via INTRAVENOUS

## 2013-03-05 NOTE — Progress Notes (Signed)
[facility] PROGRESS NOTE  Michelle Bonilla ZOX:[id number] DOB: 1941-09-16 DOA: 03/04/2013 PCP: Elby Showers, MD  Brief narrative 72 year old female patient with history of HTN, hypothyroid, PUD, connective tissue disease and inflammatory arthritis, lives independently, was in her usual state of health until 2 nights ago when she started having nausea, vomiting and dark colored emesis, diarrhea without abdominal pain. Her daughter called and patient appeared slightly confused over the phone. On 3/5, when her daughter went to patient's house, patient was in bed with vomitus on the floor and patient was more confused. She was taken to her PCPs office where she was found to be tachycardic and confused. She was sent to the ED where she had temperature 101.4F, tachycardic in the 130s, CTA chest without PE or acute findings and CT head negative. She was admitted for further evaluation and management. Of note, patient's 2 grandchildren (one of whom she cares for in the daytime) had similar GI symptoms.   Assessment/Plan: 1. Systemic inflammatory response syndrome, resonant on admission: Likely secondary to acute viral gastroenteritis. Clinically improved. Influenza panel negative. CSF shows large amount of red blood cells and 9 white blood cells-discussed with ID M.D. on call who suggested that this seemed like a traumatic tap and recommended discontinuing all antibiotics and monitoring. 2. Acute viral gastroenteritis: No further nausea, vomiting or diarrhea. Diet as tolerated. 3. Acute encephalopathy: Likely secondary to problem #1. Significantly improved. Monitor. Head CT and MRI brain without acute findings. 4. Sinus tachycardia: Most likely secondary to problem #1. Improved. 5. Leukocytosis: Secondary to problem #1. Resolved. 6. Hypertension: Controlled. 7. Hypothyroidism  8. Pulmonary fibrosis  Code Status: Full Family Communication: Discussed with patient's daughter at  bedside. Disposition Plan: Possible discharge home on 3/7.   Consultants:  None  Procedures:  Lumbar puncture on 3/5  Antibiotics:  IV vancomycin 3/5 > 3/6  IV Rocephin 3/5 > 3/6  IV acyclovir 3/5 > 3/6   HPI/Subjective: Mild back pain at site of lumbar puncture. Otherwise denies any other complaints. Per daughter, patient has significantly improved. Mental status changes have almost resolved except that she keeps repeating things at times.  Objective: Filed Vitals:   03/04/13 2104 03/05/13 0010 03/05/13 0601 03/05/13 1257  BP: 150/78  115/65 136/78  Pulse: 117 89 97 111  Temp: 97.3 F (36.3 C)  97.6 F (36.4 C) 98.7 F (37.1 C)  TempSrc: Oral  Oral Oral  Resp: 22  22 22   Height:      Weight:   53.1 kg (117 lb 1 oz)   SpO2: 100%  96% 100%    Intake/Output Summary (Last 24 hours) at 03/05/13 1326 Last data filed at 03/05/13 1256  Gross per 24 hour  Intake 1141.25 ml  Output   1951 ml  Net -809.75 ml   Filed Weights   03/04/13 1009 03/05/13 0601  Weight: 49.896 kg (110 lb) 53.1 kg (117 lb 1 oz)    Exam:   General exam: Comfortable.  Respiratory system: Clear. No increased work of breathing.  Cardiovascular system: S1 & S2 heard, RRR. No JVD, murmurs, gallops, clicks or pedal edema. Telemetry shows sinus rhythm in the 90s.  Gastrointestinal system: Abdomen is nondistended, soft and nontender. Normal bowel sounds heard.  Central nervous system: Alert and oriented. No focal neurological deficits.  Extremities: Symmetric 5 x 5 power.  Neck: Supple.   Data Reviewed: Basic Metabolic Panel:  Recent Labs Lab 03/04/13 1024  NA 142  K 3.8  CL 98  CO2 30  GLUCOSE 123*  BUN 17  CREATININE 1.08  CALCIUM 10.5  MG 2.0   Liver Function Tests:  Recent Labs Lab 03/04/13 1024  AST 14  ALT 8  ALKPHOS 88  BILITOT 0.3  PROT 8.5*  ALBUMIN 3.7   No results found for this basename: LIPASE, AMYLASE,  in the last 168 hours No results found for this  basename: AMMONIA,  in the last 168 hours CBC:  Recent Labs Lab 03/04/13 1024 03/04/13 2050  WBC 14.1* 8.3  NEUTROABS 12.3*  --   HGB 17.0* 13.5  HCT 46.7* 37.9  MCV 90.0 87.5  PLT 319 231   Cardiac Enzymes:  Recent Labs Lab 03/04/13 2051 03/05/13 0202 03/05/13 0758  TROPONINI <0.30 <0.30 <0.30   BNP (last 3 results)  Recent Labs  03/04/13 1024  PROBNP 1361.0*   CBG:  Recent Labs Lab 03/04/13 2058  GLUCAP 93    Recent Results (from the past 240 hour(s))  CULTURE, BLOOD (ROUTINE X 2)     Status: None   Collection Time    03/04/13  2:06 PM      Result Value Range Status   Specimen Description BLOOD ARM LEFT   Final   Special Requests BOTTLES DRAWN AEROBIC ONLY 8CC   Final   Culture  Setup Time 03/04/2013 21:52   Final   Culture     Final   Value:        BLOOD CULTURE RECEIVED NO GROWTH TO DATE CULTURE WILL BE HELD FOR 5 DAYS BEFORE ISSUING A FINAL NEGATIVE REPORT   Report Status PENDING   Incomplete  CULTURE, BLOOD (ROUTINE X 2)     Status: None   Collection Time    03/04/13  2:08 PM      Result Value Range Status   Specimen Description BLOOD HAND LEFT   Final   Special Requests BOTTLES DRAWN AEROBIC ONLY 10CC   Final   Culture  Setup Time 03/04/2013 21:52   Final   Culture     Final   Value:        BLOOD CULTURE RECEIVED NO GROWTH TO DATE CULTURE WILL BE HELD FOR 5 DAYS BEFORE ISSUING A FINAL NEGATIVE REPORT   Report Status PENDING   Incomplete  URINE CULTURE     Status: None   Collection Time    03/04/13  2:28 PM      Result Value Range Status   Specimen Description URINE, RANDOM   Final   Special Requests Normal   Final   Culture  Setup Time 03/04/2013 14:47   Final   Colony Count NO GROWTH   Final   Culture NO GROWTH   Final   Report Status 03/05/2013 FINAL   Final  CSF CULTURE     Status: None   Collection Time    03/04/13  6:53 PM      Result Value Range Status   Specimen Description CSF   Final   Special Requests NO 2 2.5C   Final   Gram  Stain     Final   Value: WBC PRESENT,BOTH PMN AND MONONUCLEAR     NO ORGANISMS SEEN     CYTOSPUN Performed at [facility]   Culture NO GROWTH   Final   Report Status PENDING   Incomplete  GRAM STAIN     Status: None   Collection Time    03/04/13  6:53 PM      Result Value Range Status   Specimen Description  CSF   Final   Special Requests NONE   Final   Gram Stain     Final   Value: CYTOSPIN PREP     WBC PRESENT,BOTH PMN AND MONONUCLEAR     NO ORGANISMS SEEN   Report Status 03/04/2013 FINAL   Final     Studies: Dg Chest 1 View  03/04/2013  *RADIOLOGY REPORT*  Clinical Data: Shortness of breath  CHEST - 1 VIEW  Comparison: 12/04/2012  Findings: Normal heart size and mediastinal contours. Minimal pulmonary vascular congestion. Bibasilar atelectasis and fibrosis unchanged. Perihilar bronchitic changes. No definite acute infiltrate, pleural effusion or pneumothorax. 9 x 5 mm diameter left upper lobe nodular density is better defined on the previous exam. Bones appear diffusely demineralized.  IMPRESSION: Bibasilar atelectasis and fibrosis. Questionable nodular density left upper lobe 9 x 5 mm; please refer to CT chest exam of 03/04/2013.   Original Report Authenticated By: Ulyses Southward, M.D.    Ct Head Wo Contrast  03/04/2013  *RADIOLOGY REPORT*  Clinical Data: Altered mental status  CT HEAD WITHOUT CONTRAST  Technique:  Contiguous axial images were obtained from the base of the skull through the vertex without contrast.  Comparison: None.  Findings: Motion degraded images.  No evidence of parenchymal hemorrhage or extra-axial fluid collection. No mass lesion, mass effect, or midline shift.  No CT evidence of acute infarction.  Subcortical white matter and periventricular small vessel ischemic changes.  The visualized paranasal sinuses are essentially clear. The mastoid air cells are unopacified.  No evidence of calvarial fracture.  IMPRESSION: Motion degraded images.  No evidence of acute  intracranial abnormality.  Small vessel ischemic changes.   Original Report Authenticated By: Charline Bills, M.D.    Ct Angio Chest Pe W/cm &/or Wo Cm  03/04/2013  *RADIOLOGY REPORT*  Clinical Data: Chest pain, shortness of breath, altered mental status.  CT ANGIOGRAPHY CHEST  Technique:  Multidetector CT imaging of the chest using the standard protocol during bolus administration of intravenous contrast. Multiplanar reconstructed images including MIPs were obtained and reviewed to evaluate the vascular anatomy.  Contrast: OMNIPAQUE IOHEXOL 350 MG/ML SOLN  Comparison: Chest x-ray performed today.  Prior CT 12/08/2012  Findings: No filling defects in the pulmonary arteries to suggest pulmonary emboli.  Areas of peripheral subpleural septal thickening.  Extensive septal thickening in the lung bases bilaterally with ground-glass opacities, likely fibrosis.  This is unchanged.  No acute airspace opacities.  Small scattered mediastinal lymph nodes, likely reactive, stable since prior study. Heart is normal size. Aorta is normal caliber. Visualized thyroid and chest wall soft tissues unremarkable.  Small hiatal hernia present.  Imaging into the upper abdomen shows no acute findings.  IMPRESSION: No acute bony abnormality.  Changes of fibrosis in the lungs, stable.  No evidence of pulmonary embolus.  No acute findings.  Small hiatal hernia.   Original Report Authenticated By: Charlett Nose, M.D.    Mr Laqueta Jean Wo Contrast  03/05/2013  *RADIOLOGY REPORT*  Clinical Data: 72 year old female with altered mental status and fever.  Encephalopathy.  MRI HEAD WITHOUT AND WITH CONTRAST  Technique:  Multiplanar, multiecho pulse sequences of the brain and surrounding structures were obtained according to standard protocol without and with intravenous contrast  Contrast: 10mL MULTIHANCE GADOBENATE DIMEGLUMINE 529 MG/ML IV SOLN  Comparison: Head CT without contrast 03/04/2013.  Findings: Study is mildly degraded by motion  artifact despite repeated imaging attempts.  No restricted diffusion to suggest acute infarction.  No midline shift, mass effect,  evidence of mass lesion, ventriculomegaly, extra-axial collection or acute intracranial hemorrhage. Cervicomedullary junction and pituitary are within normal limits. Major intracranial vascular flow voids are preserved.  Scattered bilateral cerebral white matter T2 and FLAIR hyperintensity in a nonspecific pattern.  No cortical signal abnormality identified. No temporal lobe or the insular ribbon signal abnormality.  No abnormal enhancement identified.  T2 heterogeneity in the deep gray matter nuclei.  T2 hyperintensity most confluent in the left thalamus.  Brainstem and cerebellum within normal limits.  Visualized cervical spine negative except for disc and endplate degeneration.  Normal bone marrow signal.  Minor paranasal sinus mucosal thickening.  Trace right mastoid fluid.  Negative visualized nasopharynx.  Negative visualized internal auditory structures.  Negative scalp soft tissues.  IMPRESSION: 1.  Nonspecific cerebral white matter and deep gray matter nuclei signal changes without enhancement. These could be related to chronic small vessel disease, and there is no evidence of herpes encephalitis, but other viral encephalitis cannot be excluded. 2.  No acute infarct or other acute intracranial abnormality.   Original Report Authenticated By: Erskine Speed, M.D.    Dg Fluoro Guide Lumbar Puncture  03/04/2013  *RADIOLOGY REPORT*  Clinical Data:  Fever, altered mental status.  Elevated white blood cell count.  DIAGNOSTIC LUMBAR PUNCTURE UNDER FLUOROSCOPIC GUIDANCE  Fluoroscopy time:  1.2 minutes.  Technique:  Informed consent was obtained from the patient prior to the procedure, including potential complications of headache, allergy, and pain.   With the patient prone, the lower back was prepped with Betadine.  1% Lidocaine was used for local anesthesia. Lumbar puncture was  performed at the L4-5 level using a 20 gauge needle with return of cloudy and blood tinged CSF with an opening pressure of 14 cm water.   10 ml of CSF were obtained for laboratory studies.  The patient tolerated the procedure well and there were no apparent complications.  IMPRESSION: As above.  Successful lumbar puncture.  Specimen sent to the laboratory.   Original Report Authenticated By: Holley Dexter, M.D.      Additional labs:   Scheduled Meds: . atenolol  50 mg Oral BID  . enoxaparin (LOVENOX) injection  40 mg Subcutaneous Q24H  . pantoprazole (PROTONIX) IV  40 mg Intravenous Q24H  . predniSONE  5 mg Oral Q breakfast  . sodium chloride  3 mL Intravenous Q12H   Continuous Infusions: . sodium chloride 75 mL/hr at 03/04/13 2151    Active Problems:   Pulmonary fibrosis   Acute encephalopathy   Fever    Time spent: 45 minutes    [facility]  [facility] Pager [phone].   If 8PM-8AM, please contact night-coverage at [email or web address], password [facility] 03/05/2013, 1:26 PM  LOS: 1 day

## 2013-03-05 NOTE — Evaluation (Signed)
Clinical/Bedside Swallow Evaluation Patient Details  Name: Michelle Bonilla MRN: [id number] Date of Birth: [birth year]  Today's Date: 03/05/2013 Time: 0820-0832 SLP Time Calculation (min): 12 min  Past Medical History:  Past Medical History  Diagnosis Date  . Hypertension   . Hypothyroid 03/06/2011    per patient  . PUD (peptic ulcer disease)     Dr Renae Fickle  . Connective tissue disease   . Inflammatory arthritis    Past Surgical History: History reviewed. No pertinent past surgical history. HPI:  72 yr old admitted with AMS, SOB and fever.  CXR with bibasilar atelectasis and fibrosis adn questionable nodular density.  PMH:  pulmonary fibrosis, HTN, bronchiectasis.  Pt. and daughter deny previous dysphagia.   Assessment / Plan / Recommendation Clinical Impression  Pt. demonstrated a functional oral and pharyngeal phases of swallow.  No indications of aspiration during assessment and no significant risk factors for dysphagia in history.  Recommend regular diet texture and thin liquids, pills whole in applesauce.  No ST f/u needed.     Aspiration Risk  Mild    Diet Recommendation Regular;Thin liquid   Liquid Administration via: Straw;Cup Medication Administration: Whole meds with liquid Supervision: Patient able to self feed Compensations: Slow rate;Small sips/bites Postural Changes and/or Swallow Maneuvers: Seated upright 90 degrees;Upright 30-60 min after meal    Other  Recommendations Oral Care Recommendations: Oral care BID   Follow Up Recommendations  None    Frequency and Duration        Pertinent Vitals/Pain none       Swallow Study Prior Functional Status       General HPI: 72 yr old admitted with AMS, SOB and fever.  CXR with bibasilar atelectasis and fibrosis adn questionable nodular density.  PMH:  pulmonary fibrosis, HTN, bronchiectasis.  Pt. and daughter deny previous dysphagia. Type of Study: Bedside swallow evaluation Diet Prior to this Study:  NPO Temperature Spikes Noted: No Respiratory Status: Supplemental O2 delivered via (comment) History of Recent Intubation: No Behavior/Cognition: Alert;Pleasant mood;Cooperative Oral Cavity - Dentition: Adequate natural dentition Self-Feeding Abilities: Able to feed self Patient Positioning: Upright in bed Baseline Vocal Quality: Clear Volitional Cough: Strong Volitional Swallow: Able to elicit    Oral/Motor/Sensory Function Overall Oral Motor/Sensory Function: Appears within functional limits for tasks assessed   Ice Chips Ice chips: Not tested   Thin Liquid Thin Liquid: Within functional limits Presentation: Cup;Straw    Nectar Thick Nectar Thick Liquid: Not tested   Honey Thick Honey Thick Liquid: Not tested   Puree Puree: Within functional limits   Solid       Solid: Within functional limits       Royce Macadamia M.Ed [employer] [phone]  03/05/2013

## 2013-03-05 NOTE — Progress Notes (Signed)
Occupational Therapy Evaluation Patient Details Name: Michelle Bonilla MRN: [id number] DOB: 1941-11-25 Today's Date: 03/05/2013 Time: 0865-7846 OT Time Calculation (min): 23 min  OT Assessment / Plan / Recommendation Clinical Impression    Pt admitted with AMS and gastroenteritis. PTA, pt lived alone independently. Pt currently requires 24/7 s due to apparent cognitive deficits/confusion. Discussed need for S with daughter, who verbalizes understanding. Also discussed home safety and ways to reduce falls/injury. No further OT indicated.    OT Assessment  Patient does not need any further OT services    Follow Up Recommendations  No OT follow up    Barriers to Discharge      Equipment Recommendations  None recommended by OT    Recommendations for Other Services    Frequency       Precautions / Restrictions Precautions Precautions: Fall   Pertinent Vitals/Pain no apparent distress     ADL  Grooming: Wash/dry face;Teeth care;Modified independent Where Assessed - Grooming: Unsupported standing Toilet Transfer: Radiographer, therapeutic Method: Other (comment) (ambulating) Toilet Transfer Equipment: Comfort height toilet Equipment Used: Gait belt Transfers/Ambulation Related to ADLs: S (for safety/lines) ADL Comments: overall S    OT Diagnosis:    OT Problem List:   OT Treatment Interventions:     OT Goals Acute Rehab OT Goals OT Goal Formulation:  (eval only)  Visit Information  Last OT Received On: 03/05/13 Assistance Needed: +1 PT/OT Co-Evaluation/Treatment: Yes    Subjective Data      Prior Functioning     Home Living Lives With: Alone Available Help at Discharge: Family;Available 24 hours/day Type of Home: House Home Access: Stairs to enter [employer] of Steps: 1 Home Layout: One level Bathroom Shower/Tub: Network engineer: None Prior Function Level of Independence:  Independent Able to Take Stairs?: Yes Driving: No Vocation: Other (comment) Comments: takes care of 3yo grandchild during the day Communication Communication: No difficulties         Vision/Perception     Cognition  Cognition Overall Cognitive Status: Impaired Area of Impairment: Safety/judgement;Memory Arousal/Alertness: Awake/alert Orientation Level: Time;Disoriented to (able to correct with cueing and increased time) Behavior During Session: [facility] for tasks performed Current Attention Level: Sustained Attention - Other Comments: internally distracted Memory: Decreased recall of precautions Memory Deficits: pt unable to recall tasks she already completed, combed her hair twice Safety/Judgement: Decreased awareness of need for assistance Safety/Judgement - Other Comments: decreased awareness of environment  Awareness of Deficits: poor awareness of cognitive deficits Problem Solving: min A Executive Functioning: decreased Cognition - Other Comments: discussed need for initial 24/7 S after D/C with daughter due to cognitive deficits. discussed ome safety and reducing risk of falls/injury    Extremity/Trunk Assessment Right Upper Extremity Assessment RUE ROM/Strength/Tone: WFL for tasks assessed Left Upper Extremity Assessment LUE ROM/Strength/Tone: WFL for tasks assessed Right Lower Extremity Assessment RLE ROM/Strength/Tone: Within functional levels Left Lower Extremity Assessment LLE ROM/Strength/Tone: Within functional levels Trunk Assessment Trunk Assessment: Normal     Mobility Bed Mobility Bed Mobility: Supine to Sit Supine to Sit: 5: Supervision;HOB flat Sit to Supine: 7: Independent Details for Bed Mobility Assistance: supervision for cueing for safety due to lines Transfers Transfers: Sit to Stand;Stand to Sit Sit to Stand: 5: Supervision;From bed Stand to Sit: 5: Supervision;To chair/3-in-1 Details for Transfer Assistance: supervision for safety with lines      Exercise     Balance Balance Balance Assessed:  (WFL for ADL)   End of Session OT -  End of Session Equipment Utilized During Treatment: Gait belt Activity Tolerance: Patient tolerated treatment well Patient left: in chair;with call bell/phone within reach;with family/visitor present Nurse Communication: Mobility status  GO     WARD,HILLARY 03/05/2013, 4:18 PM [facility], OTR/L  [phone] 03/05/2013

## 2013-03-05 NOTE — Evaluation (Signed)
Physical Therapy Evaluation Patient Details Name: Michelle Bonilla MRN: [id number] DOB: [birth year] Today's Date: 03/05/2013 Time: 2952-8413 PT Time Calculation (min): 25 min  PT Assessment / Plan / Recommendation Clinical Impression  Pt admitted with AMS and gastroenteritis with current mobility at baseline but requires supervision for safety due to cognitive deficits. Pt unaware of IV, difficulty stating month and year and required cueing to correct time orientation. Dgtr present throughout eval and educated for safety concerns for discharge and need for 24hr supervision for safety until cognition clears. No further PT needs at this time. Pt and dgtr educated to continue mobility with supervision and  educated for safety concerns with pt at home given impaired cognition.     PT Assessment  Patent does not need any further PT services    Follow Up Recommendations  No PT follow up    Does the patient have the potential to tolerate intense rehabilitation      Barriers to Discharge        Equipment Recommendations  None recommended by PT    Recommendations for Other Services     Frequency      Precautions / Restrictions Precautions Precautions: Fall   Pertinent Vitals/Pain No pain      Mobility  Bed Mobility Bed Mobility: Supine to Sit Supine to Sit: 5: Supervision;HOB flat Details for Bed Mobility Assistance: supervision for cueing for safety due to lines Transfers Transfers: Sit to Stand;Stand to Sit Sit to Stand: 5: Supervision;From bed Stand to Sit: 5: Supervision;To chair/3-in-1 Details for Transfer Assistance: supervision for safety with lines Ambulation/Gait Ambulation/Gait Assistance: 5: Supervision Ambulation Distance (Feet): 200 Feet Assistive device: None Ambulation/Gait Assistance Details: supervision for safety pt able to accurately use directional signs to find her way back to room Gait Pattern: Within Functional Limits Gait velocity: [facility]    Exercises      PT Diagnosis:    PT Problem List:   PT Treatment Interventions:     PT Goals    Visit Information  Last PT Received On: 03/05/13 Assistance Needed: +1    Subjective Data  Subjective: I don't think I was confused Patient Stated Goal: go home   Prior Functioning  Home Living Lives With: Alone Available Help at Discharge: Family;Available 24 hours/day Type of Home: House Home Access: Stairs to enter [employer] of Steps: 1 Home Layout: One level Bathroom Shower/Tub: Network engineer: None Prior Function Level of Independence: Independent Able to Take Stairs?: Yes Driving: No Vocation: Other (comment) Comments: takes care of 3yo grandchild during the day Communication Communication: No difficulties    Cognition  Cognition Overall Cognitive Status: Impaired Area of Impairment: Safety/judgement;Memory Arousal/Alertness: Awake/alert Orientation Level: Time;Disoriented to (able to correct with cueing and increased time) Behavior During Session: [facility] for tasks performed Memory Deficits: pt unable to recall tasks she already completed, combed her hair twice Safety/Judgement: Decreased awareness of need for assistance Safety/Judgement - Other Comments: decreased awareness of environment     Extremity/Trunk Assessment Right Lower Extremity Assessment RLE ROM/Strength/Tone: Within functional levels Left Lower Extremity Assessment LLE ROM/Strength/Tone: Within functional levels Trunk Assessment Trunk Assessment: Normal   Balance    End of Session PT - End of Session Equipment Utilized During Treatment: Gait belt Activity Tolerance: Patient tolerated treatment well Patient left: in chair;with family/visitor present;with call bell/phone within reach  GP     Delorse Lek 03/05/2013, 4:09 PM Delaney Meigs, PT [phone]

## 2013-03-05 NOTE — Progress Notes (Signed)
UR Chart Review Completed  

## 2013-03-06 LAB — CBC
HCT: 39.1 % (ref 36.0–46.0)
Hemoglobin: 13.7 g/dL (ref 12.0–15.0)
MCV: 90.5 fL (ref 78.0–100.0)
RDW: 13 % (ref 11.5–15.5)
WBC: 9.1 10*3/uL (ref 4.0–10.5)

## 2013-03-06 MED ORDER — PANTOPRAZOLE SODIUM 40 MG PO TBEC: 40.0000 mg | DELAYED_RELEASE_TABLET | Freq: Every day | ORAL | Status: DC

## 2013-03-06 MED ORDER — PREDNISOLONE 5 MG PO TABS: 7.5000 mg | ORAL_TABLET | Freq: Two times a day (BID) | ORAL | Status: DC

## 2013-03-06 NOTE — Discharge Summary (Signed)
Physician Discharge Summary  Michelle Bonilla ZOX:[id number] DOB: 10/04/1941 DOA: 03/04/2013  PCP: Elby Showers, MD  Admit date: 03/04/2013 Discharge date: 03/06/2013  Time spent: Greater than 30 minutes  Recommendations for Outpatient Follow-up:  1. With Dr. Merlene Laughter, PCP on 03/09/13-to be seen with results of final blood culture that were drawn in the hospital on 03/04/13.  Discharge Diagnoses:  Principal Problem:   SIRS (systemic inflammatory response syndrome) Active Problems:   Pulmonary fibrosis   Acute encephalopathy   Fever   Acute gastroenteritis   Sinus tachycardia   Discharge Condition: Improved & Stable  Diet recommendation: Heart healthy  Filed Weights   03/04/13 1009 03/05/13 0601 03/06/13 0555  Weight: 49.896 kg (110 lb) 53.1 kg (117 lb 1 oz) 48.1 kg (106 lb 0.7 oz)    History of present illness:  72 year old female patient with history of HTN, hypothyroid, PUD, connective tissue disease and inflammatory arthritis, lives independently, was in her usual state of health until 2 nights ago when she started having nausea, vomiting and dark colored emesis, diarrhea without abdominal pain. Her daughter called and patient appeared slightly confused over the phone. On 3/5, when her daughter went to patient's house, patient was in bed with vomitus on the floor and patient was more confused. She was taken to her PCPs office where she was found to be tachycardic and confused. She was sent to the ED where she had temperature 101.65F, tachycardic in the 130s, CTA chest without PE or acute findings and CT head negative. She was admitted for further evaluation and management. Of note, patient's 2 grandchildren (one of whom she cares for in the daytime) had similar GI symptoms.  Hospital Course:  1. Systemic inflammatory response syndrome, resonant on admission: Likely secondary to acute viral gastroenteritis. Patient was admitted to the hospital, all cultures were drawn and patient  was empirically started on IV vancomycin, Rocephin and acyclovir to cover for meningitis. Influenza panel negative. CSF showed large amount of red blood cells and 9 white blood cells-discussed with ID M.D. on call who suggested that this seemed like a traumatic tap and recommended discontinuing all antibiotics and monitoring. All antimicrobial agents were discontinued greater than 24 hours ago. CSF cultures negative to date. Patient has clinically improved and continues to do well without fevers, headache, nausea or vomiting. One episode of diarrhea this morning. Good appetite. Mental status changes have almost resolved. 1/2 blood cultures from admission shows gram-positive cocci in clusters-please see discussion below. 2. Acute viral gastroenteritis: No further nausea, vomiting or diarrhea. Diet as tolerated. 3. Acute encephalopathy: Likely secondary to problem #1. Almost resolved. Head CT and MRI brain without acute findings. 4. Sinus tachycardia: Most likely secondary to problem #1. Resolved 5. Leukocytosis: Secondary to problem #1. Resolved. 6. Hypertension: Controlled. Continue oral atenolol and clonidine. 7. Hypothyroidism: Continue Synthroid  8. Pulmonary fibrosis 9. Chronic steroid use: Patient and daughter indicate that patient is on scheduled prednisone 7.5 mg by mouth twice a day? Indication? Connective tissue disease/inflammatory arthritis. She follows up with her rheumatologist Dr. [name] 10. 1/2 blood cultures positive for gram-positive cocci in clusters: Possibly a contaminant. Discussed with ID M.D. on call who agreed that it would be reasonable to discharge patient without antibiotics while final blood cultures are pending. Patient and daughter have been advised to seek immediate medical attention in the interim if she has any worsening symptoms-fever, nausea, vomiting, diarrhea, chills or rigors or anything unusual. They verbalized understanding. Called microbiology lab at  [phone] and  requested they call M.D. with final results and left contact numbers. Called and discussed with Dr. Merlene Laughter who will follow patient up in office on Monday, 03/09/13 with final blood culture results.   Procedures:  Lumbar puncture   Consultations:  None  Discharge Exam: No BM yesterday. One loose BM today. No nausea, vomiting, abdominal pain, fever. Mental status changes have almost resolved.  Complaints:  Filed Vitals:   03/05/13 0601 03/05/13 1257 03/05/13 2146 03/06/13 0555  BP: 115/65 136/78 151/91 146/86  Pulse: 97 111 104 98  Temp: 97.6 F (36.4 C) 98.7 F (37.1 C) 98.8 F (37.1 C) 99.2 F (37.3 C)  TempSrc: Oral Oral Oral Oral  Resp: 22 22 20 19   Height:      Weight: 53.1 kg (117 lb 1 oz)   48.1 kg (106 lb 0.7 oz)  SpO2: 96% 100% 99% 100%    General exam: Comfortable. Not septic or toxic looking. Respiratory system: Clear. No increased work of breathing.  Cardiovascular system: S1 & S2 heard, RRR. No JVD, murmurs, gallops, clicks or pedal edema. Telemetry shows sinus rhythm in the 80s-90s.  Gastrointestinal system: Abdomen is nondistended, soft and nontender. Normal bowel sounds heard.  Central nervous system: Alert and oriented. No focal neurological deficits.  Extremities: Symmetric 5 x 5 power.  Neck: Supple.  Discharge Instructions      Discharge Orders   Future Orders Complete By Expires     Call MD for:  extreme fatigue  As directed     Call MD for:  persistant dizziness or light-headedness  As directed     Call MD for:  persistant nausea and vomiting  As directed     Call MD for:  temperature >100.4  As directed     Call MD for:  As directed     Comments:      Persisting or worsening diarrhea. Confusion.    Diet - low sodium heart healthy  As directed     Increase activity slowly  As directed         Medication List    TAKE these medications       atenolol 50 MG tablet  Commonly known as:  TENORMIN  Take 50 mg by mouth  2 (two) times daily.     cloNIDine 0.1 MG tablet  Commonly known as:  CATAPRES  Take 1 tablet (0.1 mg total) by mouth 2 (two) times daily.     gemfibrozil 600 MG tablet  Commonly known as:  LOPID  Take 600 mg by mouth 2 (two) times daily.     levothyroxine 75 MCG tablet  Commonly known as:  SYNTHROID, LEVOTHROID  Take 75 mcg by mouth daily.     pantoprazole 40 MG tablet  Commonly known as:  PROTONIX  Take 40 mg by mouth daily before breakfast.     prednisoLONE 5 MG Tabs  Take 1.5 tablets (7.5 mg total) by mouth 2 (two) times daily.       Follow-up Information   Follow up with Ginette Otto, MD. Schedule an appointment as soon as possible for a visit on 03/09/2013. (To be seen with final blood culture results.)    Contact information:   [address] [place] [address] [phone]        The results of significant diagnostics from this hospitalization (including imaging, microbiology, ancillary and laboratory) are listed below for reference.    Significant Diagnostic Studies: Dg Chest 1 View  03/04/2013  *RADIOLOGY  REPORT*  Clinical Data: Shortness of breath  CHEST - 1 VIEW  Comparison: 12/04/2012  Findings: Normal heart size and mediastinal contours. Minimal pulmonary vascular congestion. Bibasilar atelectasis and fibrosis unchanged. Perihilar bronchitic changes. No definite acute infiltrate, pleural effusion or pneumothorax. 9 x 5 mm diameter left upper lobe nodular density is better defined on the previous exam. Bones appear diffusely demineralized.  IMPRESSION: Bibasilar atelectasis and fibrosis. Questionable nodular density left upper lobe 9 x 5 mm; please refer to CT chest exam of 03/04/2013.   Original Report Authenticated By: Ulyses Southward, M.D.    Ct Head Wo Contrast  03/04/2013  *RADIOLOGY REPORT*  Clinical Data: Altered mental status  CT HEAD WITHOUT CONTRAST  Technique:  Contiguous axial images were obtained from the base of the skull through  the vertex without contrast.  Comparison: None.  Findings: Motion degraded images.  No evidence of parenchymal hemorrhage or extra-axial fluid collection. No mass lesion, mass effect, or midline shift.  No CT evidence of acute infarction.  Subcortical white matter and periventricular small vessel ischemic changes.  The visualized paranasal sinuses are essentially clear. The mastoid air cells are unopacified.  No evidence of calvarial fracture.  IMPRESSION: Motion degraded images.  No evidence of acute intracranial abnormality.  Small vessel ischemic changes.   Original Report Authenticated By: Charline Bills, M.D.    Ct Angio Chest Pe W/cm &/or Wo Cm  03/04/2013  *RADIOLOGY REPORT*  Clinical Data: Chest pain, shortness of breath, altered mental status.  CT ANGIOGRAPHY CHEST  Technique:  Multidetector CT imaging of the chest using the standard protocol during bolus administration of intravenous contrast. Multiplanar reconstructed images including MIPs were obtained and reviewed to evaluate the vascular anatomy.  Contrast: OMNIPAQUE IOHEXOL 350 MG/ML SOLN  Comparison: Chest x-ray performed today.  Prior CT 12/08/2012  Findings: No filling defects in the pulmonary arteries to suggest pulmonary emboli.  Areas of peripheral subpleural septal thickening.  Extensive septal thickening in the lung bases bilaterally with ground-glass opacities, likely fibrosis.  This is unchanged.  No acute airspace opacities.  Small scattered mediastinal lymph nodes, likely reactive, stable since prior study. Heart is normal size. Aorta is normal caliber. Visualized thyroid and chest wall soft tissues unremarkable.  Small hiatal hernia present.  Imaging into the upper abdomen shows no acute findings.  IMPRESSION: No acute bony abnormality.  Changes of fibrosis in the lungs, stable.  No evidence of pulmonary embolus.  No acute findings.  Small hiatal hernia.   Original Report Authenticated By: Charlett Nose, M.D.    Mr Laqueta Jean Wo  Contrast  03/05/2013  *RADIOLOGY REPORT*  Clinical Data: 72 year old female with altered mental status and fever.  Encephalopathy.  MRI HEAD WITHOUT AND WITH CONTRAST  Technique:  Multiplanar, multiecho pulse sequences of the brain and surrounding structures were obtained according to standard protocol without and with intravenous contrast  Contrast: 10mL MULTIHANCE GADOBENATE DIMEGLUMINE 529 MG/ML IV SOLN  Comparison: Head CT without contrast 03/04/2013.  Findings: Study is mildly degraded by motion artifact despite repeated imaging attempts.  No restricted diffusion to suggest acute infarction.  No midline shift, mass effect, evidence of mass lesion, ventriculomegaly, extra-axial collection or acute intracranial hemorrhage. Cervicomedullary junction and pituitary are within normal limits. Major intracranial vascular flow voids are preserved.  Scattered bilateral cerebral white matter T2 and FLAIR hyperintensity in a nonspecific pattern.  No cortical signal abnormality identified. No temporal lobe or the insular ribbon signal abnormality.  No abnormal enhancement identified.  T2 heterogeneity in  the deep gray matter nuclei.  T2 hyperintensity most confluent in the left thalamus.  Brainstem and cerebellum within normal limits.  Visualized cervical spine negative except for disc and endplate degeneration.  Normal bone marrow signal.  Minor paranasal sinus mucosal thickening.  Trace right mastoid fluid.  Negative visualized nasopharynx.  Negative visualized internal auditory structures.  Negative scalp soft tissues.  IMPRESSION: 1.  Nonspecific cerebral white matter and deep gray matter nuclei signal changes without enhancement. These could be related to chronic small vessel disease, and there is no evidence of herpes encephalitis, but other viral encephalitis cannot be excluded. 2.  No acute infarct or other acute intracranial abnormality.   Original Report Authenticated By: Erskine Speed, M.D.    Dg Fluoro Guide  Lumbar Puncture  03/04/2013  *RADIOLOGY REPORT*  Clinical Data:  Fever, altered mental status.  Elevated white blood cell count.  DIAGNOSTIC LUMBAR PUNCTURE UNDER FLUOROSCOPIC GUIDANCE  Fluoroscopy time:  1.2 minutes.  Technique:  Informed consent was obtained from the patient prior to the procedure, including potential complications of headache, allergy, and pain.   With the patient prone, the lower back was prepped with Betadine.  1% Lidocaine was used for local anesthesia. Lumbar puncture was performed at the L4-5 level using a 20 gauge needle with return of cloudy and blood tinged CSF with an opening pressure of 14 cm water.   10 ml of CSF were obtained for laboratory studies.  The patient tolerated the procedure well and there were no apparent complications.  IMPRESSION: As above.  Successful lumbar puncture.  Specimen sent to the laboratory.   Original Report Authenticated By: Holley Dexter, M.D.     Microbiology: Recent Results (from the past 240 hour(s))  CULTURE, BLOOD (ROUTINE X 2)     Status: None   Collection Time    03/04/13  2:06 PM      Result Value Range Status   Specimen Description BLOOD ARM LEFT   Final   Special Requests BOTTLES DRAWN AEROBIC ONLY 8CC   Final   Culture  Setup Time 03/04/2013 21:52   Final   Culture     Final   Value: GRAM POSITIVE COCCI IN CLUSTERS     Note: Gram Stain Report Called to,Read Back By and Verified With: HELENA STREET 03/05/13 1515 BY SMITHERSJ   Report Status PENDING   Incomplete  CULTURE, BLOOD (ROUTINE X 2)     Status: None   Collection Time    03/04/13  2:08 PM      Result Value Range Status   Specimen Description BLOOD HAND LEFT   Final   Special Requests BOTTLES DRAWN AEROBIC ONLY 10CC   Final   Culture  Setup Time 03/04/2013 21:52   Final   Culture     Final   Value:        BLOOD CULTURE RECEIVED NO GROWTH TO DATE CULTURE WILL BE HELD FOR 5 DAYS BEFORE ISSUING A FINAL NEGATIVE REPORT   Report Status PENDING   Incomplete  URINE  CULTURE     Status: None   Collection Time    03/04/13  2:28 PM      Result Value Range Status   Specimen Description URINE, RANDOM   Final   Special Requests Normal   Final   Culture  Setup Time 03/04/2013 14:47   Final   Colony Count NO GROWTH   Final   Culture NO GROWTH   Final   Report Status 03/05/2013 FINAL  Final  CSF CULTURE     Status: None   Collection Time    03/04/13  6:53 PM      Result Value Range Status   Specimen Description CSF   Final   Special Requests NO 2 2.5C   Final   Gram Stain     Final   Value: WBC PRESENT,BOTH PMN AND MONONUCLEAR     NO ORGANISMS SEEN     CYTOSPUN Performed at [facility]   Culture NO GROWTH 1 DAY   Final   Report Status PENDING   Incomplete  GRAM STAIN     Status: None   Collection Time    03/04/13  6:53 PM      Result Value Range Status   Specimen Description CSF   Final   Special Requests NONE   Final   Gram Stain     Final   Value: CYTOSPIN PREP     WBC PRESENT,BOTH PMN AND MONONUCLEAR     NO ORGANISMS SEEN   Report Status 03/04/2013 FINAL   Final     Labs: Basic Metabolic Panel:  Recent Labs Lab 03/04/13 1024  NA 142  K 3.8  CL 98  CO2 30  GLUCOSE 123*  BUN 17  CREATININE 1.08  CALCIUM 10.5  MG 2.0   Liver Function Tests:  Recent Labs Lab 03/04/13 1024  AST 14  ALT 8  ALKPHOS 88  BILITOT 0.3  PROT 8.5*  ALBUMIN 3.7   No results found for this basename: LIPASE, AMYLASE,  in the last 168 hours No results found for this basename: AMMONIA,  in the last 168 hours CBC:  Recent Labs Lab 03/04/13 1024 03/04/13 2050 03/06/13 0610  WBC 14.1* 8.3 9.1  NEUTROABS 12.3*  --   --   HGB 17.0* 13.5 13.7  HCT 46.7* 37.9 39.1  MCV 90.0 87.5 90.5  PLT 319 231 241   Cardiac Enzymes:  Recent Labs Lab 03/04/13 2051 03/05/13 0202 03/05/13 0758  TROPONINI <0.30 <0.30 <0.30   BNP: BNP (last 3 results)  Recent Labs  03/04/13 1024  PROBNP 1361.0*   CBG:  Recent Labs Lab 03/04/13 2058   GLUCAP 93    Additional labs:  TSH: 0.795.  Influenza panel: Negative.  CSF HSV 1 and HSV-2 DNA PCR: Negative  UA: Negative for features of UTI.  CSF: Glucose 61, total protein 53, RBC 6495, WBC 9, segmented neutrophils 93%, lymphocytes 5 and monocytes 2.    SignedMarcellus Scott  [facility] 03/06/2013, 2:23 PM

## 2013-03-06 NOTE — Progress Notes (Signed)
Pt discfharged to home with understanding of fu orders and meds and MD appointments ,family at bedside

## 2013-03-08 LAB — CSF CULTURE W GRAM STAIN: Culture: NO GROWTH

## 2013-03-10 LAB — CULTURE, BLOOD (ROUTINE X 2): Culture: NO GROWTH

## 2013-04-13 ENCOUNTER — Other Ambulatory Visit (INDEPENDENT_AMBULATORY_CARE_PROVIDER_SITE_OTHER): Payer: Self-pay | Admitting: Internal Medicine

## 2013-06-16 ENCOUNTER — Other Ambulatory Visit: Payer: Self-pay

## 2013-06-16 DIAGNOSIS — Z1231 Encounter for screening mammogram for malignant neoplasm of breast: Secondary | ICD-10-CM

## 2013-06-19 ENCOUNTER — Ambulatory Visit: Payer: Medicare Other | Admitting: Internal Medicine

## 2013-07-09 IMAGING — CT CT HEAD W/O CM
1 of 2 series · 16 of 30 positions shown, 20 images · non-contrast
Comparison: None.

CLINICAL DATA: Altered mental status

CT HEAD WITHOUT CONTRAST
TECHNIQUE: Contiguous axial images were obtained from the base of
the skull through the vertex without contrast.

[Series 2: head routine 4.8 h37s · axial · 0.43mm/px · z∈[-65,+59]mm · 16 of 30 slices shown, 20 images]
[im 2/30  brain]
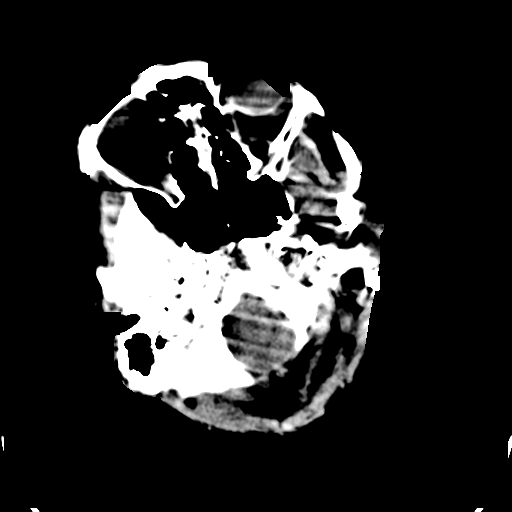
[im 2/30  bone]
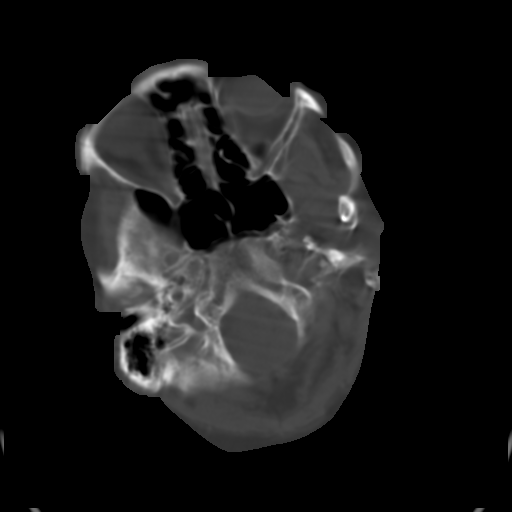
[im 3/30  brain]
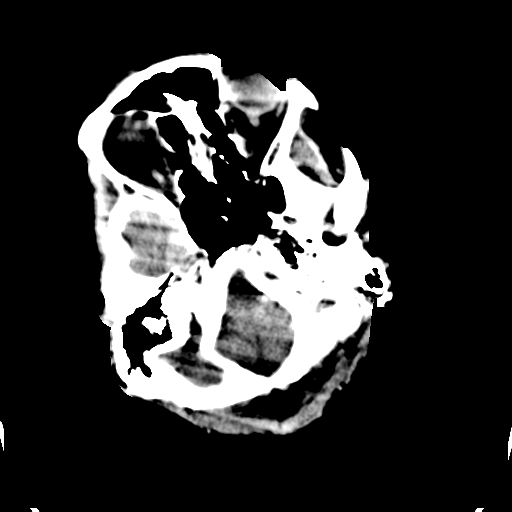
[im 5/30  brain]
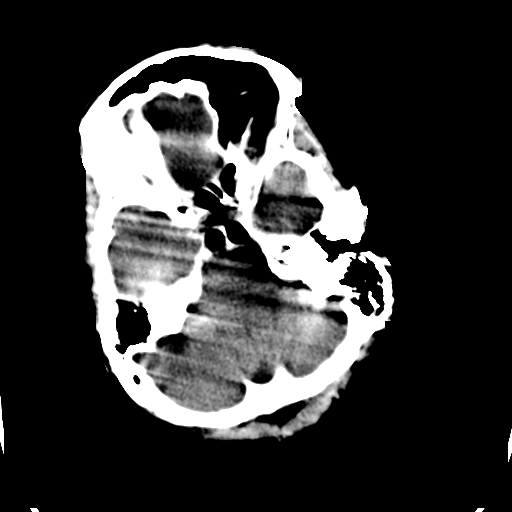
[im 8/30  brain]
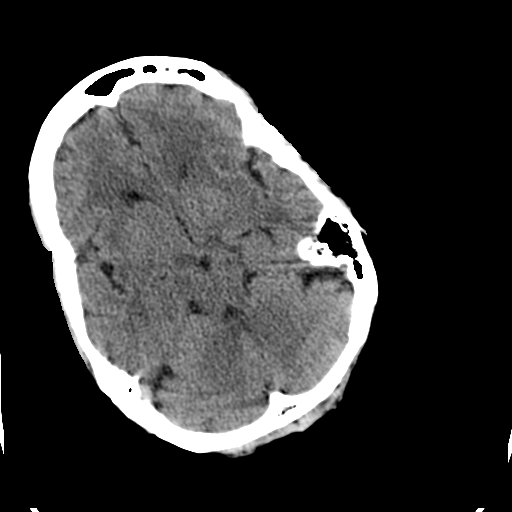
[im 9/30  brain]
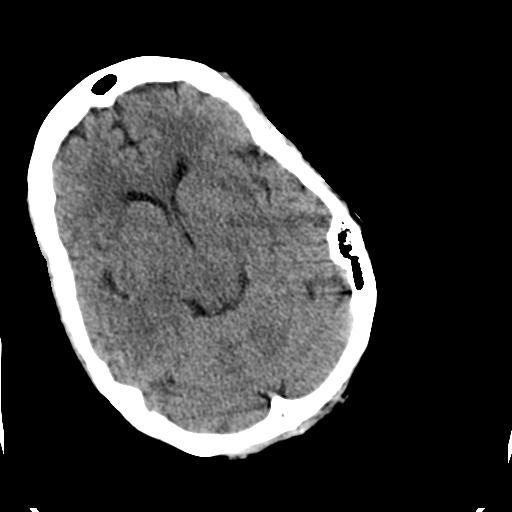
[im 9/30  bone]
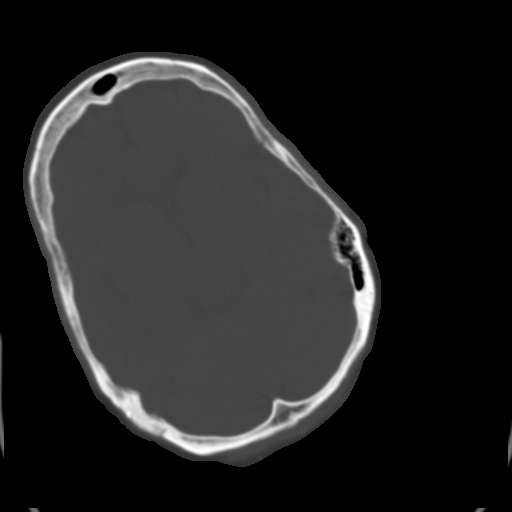
[im 11/30  brain]
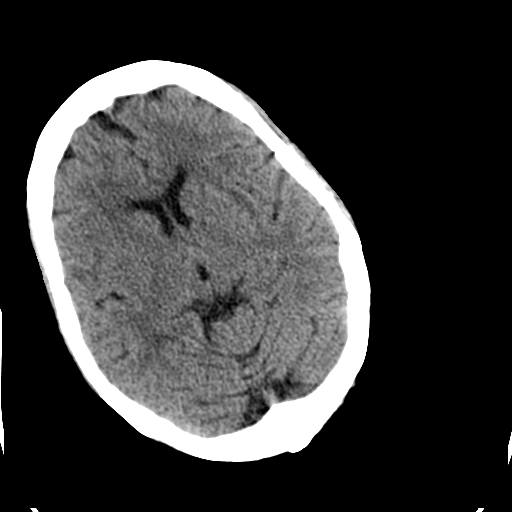
[im 12/30  brain]
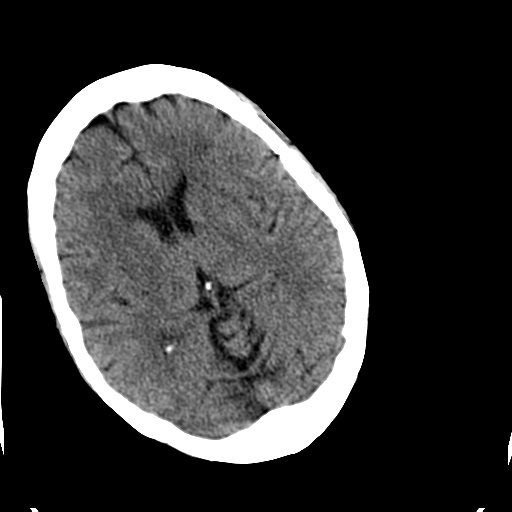
[im 14/30  brain]
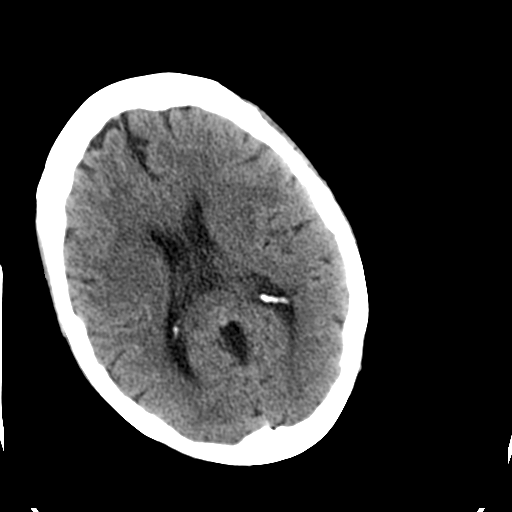
[im 16/30  brain]
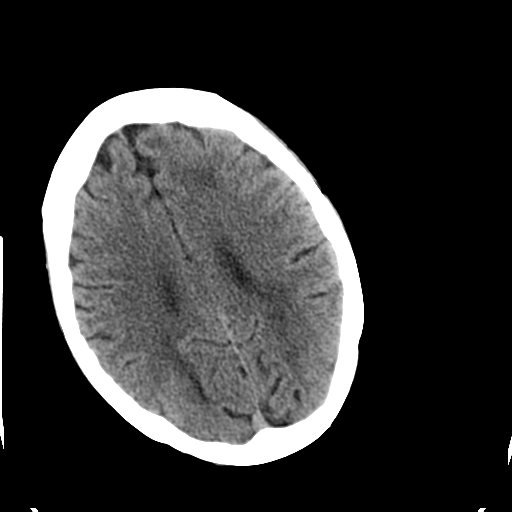
[im 16/30  bone]
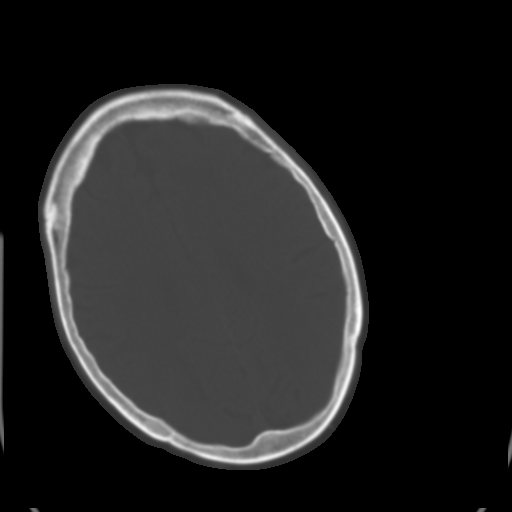
[im 18/30  brain]
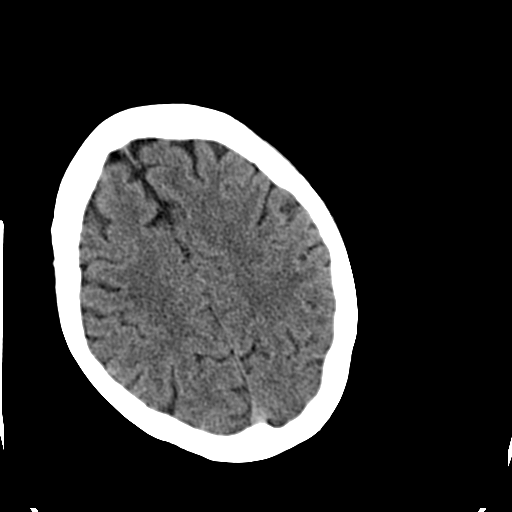
[im 19/30  brain]
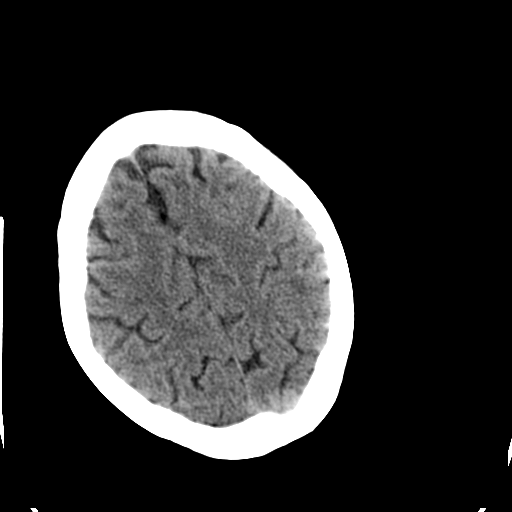
[im 21/30  brain]
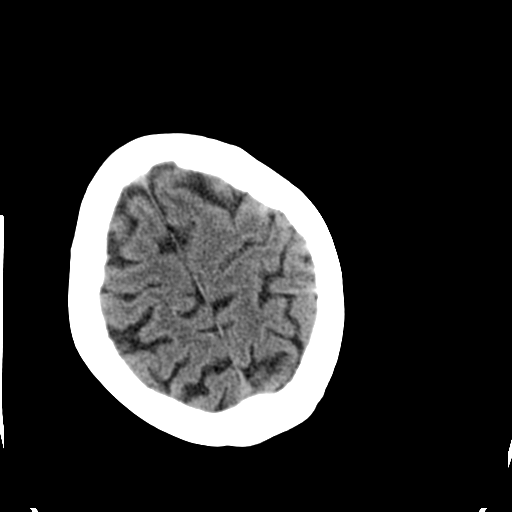
[im 22/30  brain]
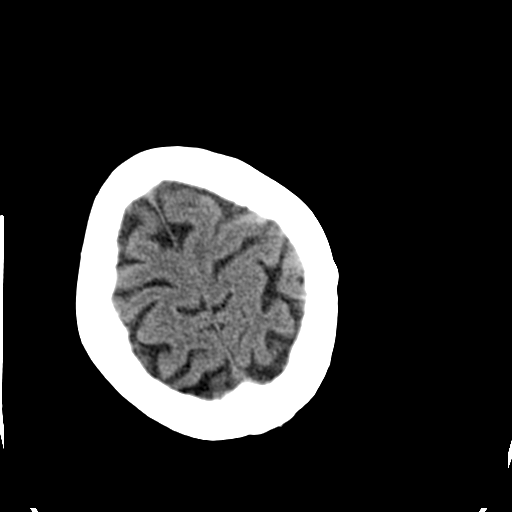
[im 22/30  bone]
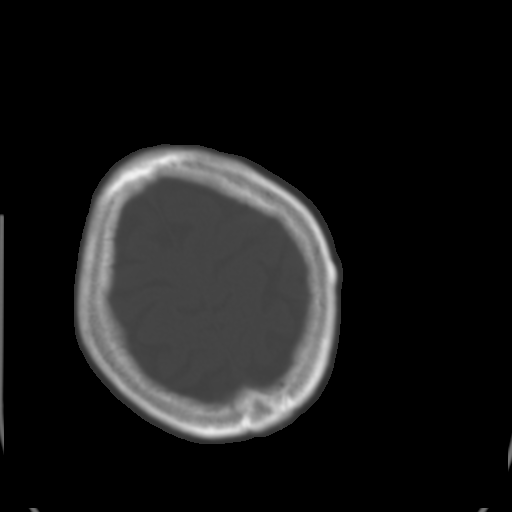
[im 25/30  brain]
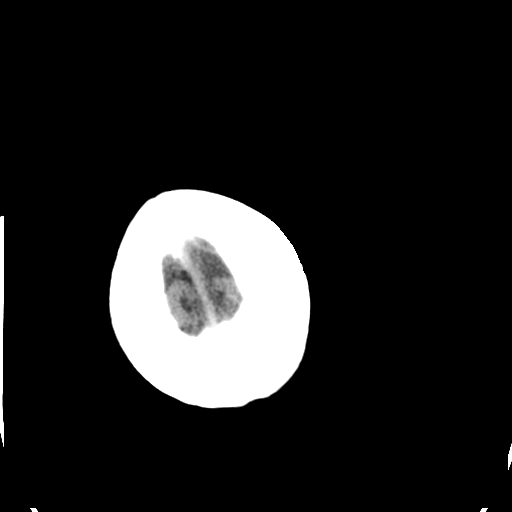
[im 27/30  brain]
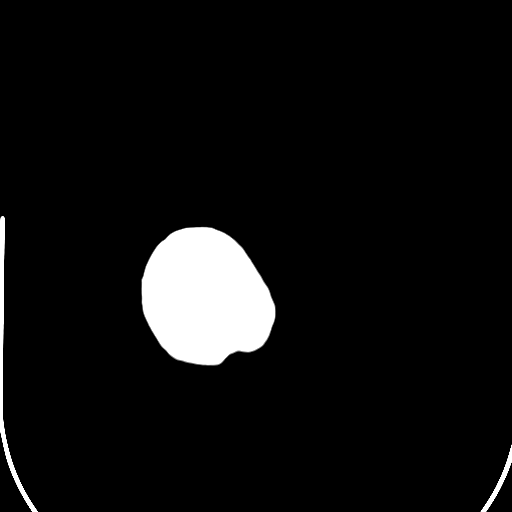
[im 28/30  brain]
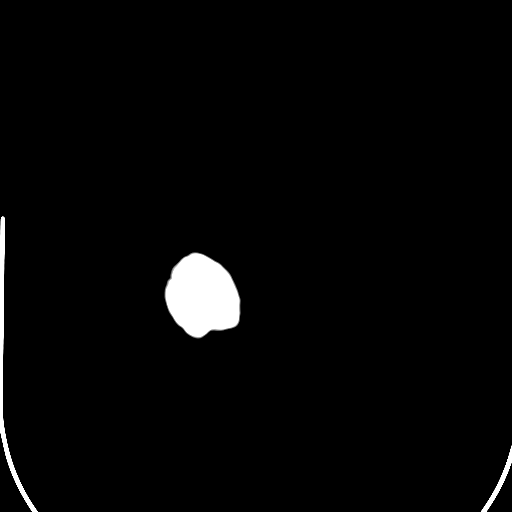

[16 of 30 positions shown; findings below may reference images not displayed]

FINDINGS: Motion degraded images.

No evidence of parenchymal hemorrhage or extra-axial fluid
collection. No mass lesion, mass effect, or midline shift.

No CT evidence of acute infarction.

Subcortical white matter and periventricular small vessel ischemic
changes.

The visualized paranasal sinuses are essentially clear. The mastoid
air cells are unopacified.

No evidence of calvarial fracture.
IMPRESSION: Motion degraded images.

No evidence of acute intracranial abnormality.

Small vessel ischemic changes.

## 2013-07-20 ENCOUNTER — Ambulatory Visit
Admission: RE | Admit: 2013-07-20 | Discharge: 2013-07-20 | Disposition: A | Discharge status: Home / Self Care | Payer: Medicare Other | Source: Ambulatory Visit

## 2013-07-20 DIAGNOSIS — Z1231 Encounter for screening mammogram for malignant neoplasm of breast: Secondary | ICD-10-CM

## 2013-07-27 ENCOUNTER — Ambulatory Visit (INDEPENDENT_AMBULATORY_CARE_PROVIDER_SITE_OTHER): Payer: Medicare Other | Admitting: Internal Medicine

## 2013-07-27 ENCOUNTER — Encounter: Payer: Self-pay | Admitting: Internal Medicine

## 2013-07-27 VITALS — BP 136/86 | HR 92 | Temp 98.1°F | Ht 61.0 in | Wt 114.2 lb

## 2013-07-27 DIAGNOSIS — J8410 Pulmonary fibrosis, unspecified: Secondary | ICD-10-CM

## 2013-07-27 NOTE — Progress Notes (Signed)
Subjective:     Patient ID: Michelle Bonilla, female   DOB: April 11, 1941    MRN: [id number]   Brief patient profile:  60 yobf never smoked perfect health x hpb/ thyroid until Oct 2011 developed rash over chest some better with prednisone then hand swelling p stopped steroids  eval by Michelle Bonilla dx connective tissue dz so restarted on prednisone 04/2011 and referred to Pulmonary clinic by Dr Michelle Bonilla 05/2011 for ILD by CT   05/24/2011 Initial pulmonary office eval in EMR era  For ILD with no limiting sob or cough and good control of arthritis on prednisone at 5 mg three times a day.  Feels joint problems are much better. No unusual exposure hx or hx of taking chemo, amiodarone or macrodantin. rec return for pft's/cxr   07/05/2011 ov/Wert cc no significant limiting sob or cough, no flare of arthritis since on prednisone but considering mtx per Dr Michelle Bonilla.   - PFT's 07/05/2011 VC   1.75   74% ratio 89,  DLCO 49% corrects to 84   rec  Methotrexate's benefit is worth the risk to reduce prednisone need but does carry the risk of worsening your breathing and if this happens you first notice it on exertion and should call me     08/22/2011 f/u ov/Wert cc less sob, able to walk around a track x 45 minutes at a time s stopping and arthritis is improving. No cough rec gerd diet Monitor ability to walk track   04/02/2012 f/u ov/Wert cc no change doe, not doing track due to weather but plans to start again, "not coughing" but clearing throat a lot since started on acei, attibutes this to "sinus drainage" but didn't have it before started acei. Arthritis good shape rec Please schedule a follow up visit in 3 months but call sooner if needed - consider d/c acei > done.   07/04/2012 f/u ov/Wert cc back to normal walking and throat problem completely cleared off acei, arthritis doing fine off prednisone for months s flare. rec  f/u prn   12/17/2012 f/u ov/Wert cc indolent onset progressive sense she's loosing ground with  activity tol, fatigue since off prednisone and sev months dry cough despite off acei and on ppi daily.  Arthritis well controlled as mtx titrated up rec Continue protonix 40 mg Take 30-60 min before first meal of the day  Prednisone 10 mg twice daily with meals and once better, one half twice daily   02/05/2013 f/u ov/Wert cc all smiles, overall much better cough gone, energy is main limiting problems, not sob, and no arthritis flare off mtx rec Change tenex to clonidine    07/27/2013 f/u ov/Wert re pf/ ra Chief Complaint  Patient presents with  . Follow-up    Pt states breathing is doing well and he denies any co's today.   Pred dose is 5 mg daily  Walking around track x 30   No obvious daytime variabilty or assoc chronic cough or cp or chest tightness, subjective wheeze overt sinus or hb symptoms. No unusual exp hx or h/o childhood pna/ asthma or knowledge of premature birth.         Sleeping ok without nocturnal  or early am exac of resp c/o's. Also denies any obvious fluctuation of symptoms with weather or environmental changes or other aggravating or alleviating factors.    Current Medications, Allergies, Complete Past Medical History, Past Surgical History, Family History, and Social History were reviewed in [employer] record.  ROS  The following are not active complaints unless bolded sore throat, dysphagia, dental problems, itching, sneezing,  nasal congestion or excess/ purulent secretions, ear ache,   fever, chills, sweats, unintended wt loss, pleuritic or exertional cp, hemoptysis,  orthopnea pnd or leg swelling, presyncope, palpitations, heartburn, abdominal pain, anorexia, nausea, vomiting, diarrhea  or change in bowel or urinary habits, change in stools or urine, dysuria,hematuria,  rash, arthralgias, visual complaints, headache, numbness weakness or ataxia or problems with walking or coordination,  change in mood/affect or memory.        [name]      PMHx Pulmonary Fibrosis/ Connective Tissue dz................Marland KitchenMarland KitchenZenovia Bonilla       - onset Oct 2011 assoc with rash       - Prednisone started 05/2011        - CT Chest 05/09/11 Stable chronic interstitial opacities in the lung bases most  compatible with fibrosis.          Mildly prominent bilateral axillary lymph nodes, left slightly  greater than right. These are stable           Objective:   Physical Exam  Pleasant amb bf nad  Mildly cushingnoid facies  Wt 102 05/24/11 >  114 07/05/11  > 121 08/22/2011  > 04/02/2012  109 > 07/04/2012  102 > 93 12/17/2012 > 104 02/05/2013 > 07/27/2013  114   HEENT: nl dentition, turbinates, and orophanx. Nl external ear canals without cough reflex   NECK :  without JVD/Nodes/TM/ nl carotid upstrokes bilaterally   LUNGS: no acc muscle use, clear to A and P bilaterally without cough on insp or exp maneuvers. No sign crackles/minimal BV changes both bases   CV:  RRR  no s3 or murmur,   No increase in P2, no edema   ABD:  soft and nontender with nl excursion in the supine position. No bruits or organomegaly, bowel sounds nl  MS:  warm without deformities, calf tenderness, cyanosis.  No clubbing  SKIN: warm and dry without lesions / nodules     Ct 12/08/12 Progressive pulmonary fibrosis and bronchiectasis. No pulmonary  masses. The new densities on chest x-ray represent focal areas of  fibrosis and secondary inflammation.    Assessment:

## 2013-07-27 NOTE — Patient Instructions (Addendum)
Next visit is due Feb 2015 with PFT's - call sooner if loosing ground in terms of activity tolerance.

## 2013-07-28 NOTE — Assessment & Plan Note (Addendum)
-   onset Oct 2011 assoc with rash       - CT Chest 05/09/11 Stable chronic interstitial opacities in the lung bases most  compatible with fibrosis.          Mildly prominent bilateral axillary lymph nodes, left slightly  greater than right. These are stable          - 05/24/11 Walked 3 laps @ 185 ft each stopped due to  End of test sats down to 90 RA   but no symptoms       - 07/05/2011 Walked 2 laps @ 185 ft each stopped due to desat to 86% RA at rapid pace, no symptoms       - PFT's 07/05/2011 VC   1.75   74% ratio 89,  DLCO 49% corrects to 84        - PFT's 02/05/2013  VC   1.94  75% ratio 83 and DLCO 64% corrects 85        - Methotrexate rx 07/2011>>> 12/17/2012 ? mtx toxicity       -07/04/2012  Walked RA x 3 laps @ 185 ft each stopped due to  End of study, sats ok      - 12/17/2012  Walked RA  2 laps @ 185 ft each stopped due to  desat to 75%> restarted pred and d/c mtx      - 02/05/2013  Walked RA  2 laps @ 185 ft each stopped due to  Sat 89%      - 07/27/2013  Walked RA x 3 laps @ 185 ft each stopped due to sat 86%  PF assoc with RA tends to mimic severity of systemic dz which is the case here, unless of course she has reaction to ra drug, which may have happened with her as well with MTX  She is actually better though still desats with exertion, no enough to warrant 02 however, but slower pacing should do fine.   See instructions for specific recommendations which were reviewed directly with the patient who was given a copy with highlighter outlining the key components.

## 2013-08-24 ENCOUNTER — Encounter (INDEPENDENT_AMBULATORY_CARE_PROVIDER_SITE_OTHER): Payer: Self-pay | Admitting: *Deleted

## 2013-08-27 ENCOUNTER — Other Ambulatory Visit (INDEPENDENT_AMBULATORY_CARE_PROVIDER_SITE_OTHER): Payer: Self-pay | Admitting: *Deleted

## 2013-08-27 ENCOUNTER — Telehealth (INDEPENDENT_AMBULATORY_CARE_PROVIDER_SITE_OTHER): Payer: Self-pay | Admitting: *Deleted

## 2013-08-27 DIAGNOSIS — Z1211 Encounter for screening for malignant neoplasm of colon: Secondary | ICD-10-CM

## 2013-08-27 MED ORDER — PEG-KCL-NACL-NASULF-NA ASC-C 100 G PO SOLR: 1.0000 | Freq: Once | ORAL | Status: DC

## 2013-08-27 NOTE — Telephone Encounter (Signed)
Patient needs movi prep -- nkda 

## 2013-09-09 ENCOUNTER — Telehealth (INDEPENDENT_AMBULATORY_CARE_PROVIDER_SITE_OTHER): Payer: Self-pay | Admitting: *Deleted

## 2013-09-09 NOTE — Telephone Encounter (Signed)
  Procedure: tcs  Reason/Indication:  screening  Has patient had this procedure before?  Yes, 2004 (EPIC)  If so, when, by whom and where?    Is there a family history of colon cancer?  no  Who?  What age when diagnosed?    Is patient diabetic?   no      Does patient have prosthetic heart valve?  no  Do you have a pacemaker?  no  Has patient ever had endocarditis? no  Has patient had joint replacement within last 12 months?  no  Is patient on Coumadin, Plavix and/or Aspirin? no  Medications: see EPIC  Allergies: nkda  Medication Adjustment:   Procedure date & time: 10/07/13 at 1200

## 2013-09-10 NOTE — Telephone Encounter (Signed)
agree

## 2013-09-22 ENCOUNTER — Encounter (HOSPITAL_COMMUNITY): Payer: Self-pay | Admitting: Pharmacy Technician

## 2013-10-07 ENCOUNTER — Encounter (HOSPITAL_COMMUNITY)
Admission: RE | Disposition: A | Discharge status: Home / Self Care | Payer: Self-pay | Source: Ambulatory Visit | Attending: Internal Medicine

## 2013-10-07 ENCOUNTER — Ambulatory Visit (HOSPITAL_COMMUNITY)
Admission: RE | Admit: 2013-10-07 | Discharge: 2013-10-07 | Disposition: A | Discharge status: Home / Self Care | Payer: Medicare Other | Source: Ambulatory Visit | Attending: Internal Medicine | Admitting: Internal Medicine

## 2013-10-07 ENCOUNTER — Encounter (HOSPITAL_COMMUNITY): Payer: Self-pay | Admitting: *Deleted

## 2013-10-07 DIAGNOSIS — Z1211 Encounter for screening for malignant neoplasm of colon: Secondary | ICD-10-CM | POA: Insufficient documentation

## 2013-10-07 DIAGNOSIS — D126 Benign neoplasm of colon, unspecified: Secondary | ICD-10-CM

## 2013-10-07 DIAGNOSIS — I10 Essential (primary) hypertension: Secondary | ICD-10-CM | POA: Insufficient documentation

## 2013-10-07 DIAGNOSIS — K644 Residual hemorrhoidal skin tags: Secondary | ICD-10-CM

## 2013-10-07 HISTORY — PX: COLONOSCOPY: SHX5424

## 2013-10-07 SURGERY — COLONOSCOPY
Anesthesia: Moderate Sedation

## 2013-10-07 MED ORDER — MEPERIDINE HCL 50 MG/ML IJ SOLN
INTRAMUSCULAR | Status: DC | PRN
  Administered 2013-10-07 (×2): 25 mg via INTRAVENOUS

## 2013-10-07 MED ORDER — MEPERIDINE HCL 50 MG/ML IJ SOLN
INTRAMUSCULAR | Status: AC
  Filled 2013-10-07: qty 1

## 2013-10-07 MED ORDER — SODIUM CHLORIDE 0.9 % IV SOLN
INTRAVENOUS | Status: DC
  Administered 2013-10-07: 12:00:00 via INTRAVENOUS

## 2013-10-07 MED ORDER — MIDAZOLAM HCL 5 MG/5ML IJ SOLN
INTRAMUSCULAR | Status: DC | PRN
  Administered 2013-10-07: 2 mg via INTRAVENOUS
  Administered 2013-10-07 (×2): 1 mg via INTRAVENOUS

## 2013-10-07 MED ORDER — STERILE WATER FOR IRRIGATION IR SOLN
Status: DC | PRN
  Administered 2013-10-07: 12:00:00

## 2013-10-07 MED ORDER — MIDAZOLAM HCL 5 MG/5ML IJ SOLN
INTRAMUSCULAR | Status: AC
  Filled 2013-10-07: qty 10

## 2013-10-07 NOTE — Op Note (Signed)
COLONOSCOPY PROCEDURE REPORT  PATIENT:  Michelle Bonilla  MR#:  [id number] Birthdate:  Feb 15, 1941, 72 y.o., female Endoscopist:  Dr. Malissa Hippo, MD Referred By:  Dr. Orland Penman, MD Procedure Date: 10/07/2013  Procedure:   Colonoscopy  Indications:  Patient is 72 year old African American female is undergoing average risk screening colonoscopy.  Informed Consent:  The procedure and risks were reviewed with the patient and informed consent was obtained.  Medications:  Demerol 50 mg IV Versed 4 mg IV  Description of procedure:  After a digital rectal exam was performed, that colonoscope was advanced from the anus through the rectum and colon to the area of the cecum, ileocecal valve and appendiceal orifice. The cecum was deeply intubated. These structures were well-seen and photographed for the record. From the level of the cecum and ileocecal valve, the scope was slowly and cautiously withdrawn. The mucosal surfaces were carefully surveyed utilizing scope tip to flexion to facilitate fold flattening as needed. The scope was pulled down into the rectum where a thorough exam including retroflexion was performed.  Findings:   Prep satisfactory. 2 small polyps ablated via cold biopsy and submitted together. These are located at the cecum and ascending colon. Normal rectal mucosa. Small hemorrhoids below the dentate line with focal thickening of anoderm.   Therapeutic/Diagnostic Maneuvers Performed:  See above  Complications:  None  Cecal Withdrawal Time:  15 minutes  Impression:  Examination performed to cecum. Two small polyps ablated via cold biopsy and submitted together(cecum and ascending colon). Small external hemorrhoids.  Recommendations:  Standard instructions given. I will contact patient with biopsy results and further recommendations.  REHMAN,NAJEEB U  10/07/2013 12:58 PM  CC: Dr. Orland Penman, MD & Dr. Bonnetta Barry ref. provider  found

## 2013-10-07 NOTE — H&P (Signed)
Michelle Bonilla is an 72 y.o. female.   Chief Complaint: Patient is here for colonoscopy. HPI: Patient is 72 year old [place] American female who is here for average risk screening colonoscopy. Her last exam was 2004. She denies abdominal pain change in bowel habits or rectal bleeding. Family history is negative for CRC.  Past Medical History  Diagnosis Date  . Hypertension   . Hypothyroid 03/06/2011    per patient  . PUD (peptic ulcer disease)     Dr Renae Fickle  . Connective tissue disease   . Inflammatory arthritis     Past Surgical History  Procedure Laterality Date  . Colonoscopy    . Esophagogstroduodenoscopy      Family History  Problem Relation Age of Onset  . Breast cancer Sister   . Bone cancer Sister    Social History:  reports that she has never smoked. She has never used smokeless tobacco. She reports that she does not drink alcohol or use illicit drugs.  Allergies: No Known Allergies  Medications Prior to Admission  Medication Sig Dispense Refill  . atenolol (TENORMIN) 50 MG tablet Take 50 mg by mouth 2 (two) times daily.       . cloNIDine (CATAPRES) 0.1 MG tablet Take 1 tablet (0.1 mg total) by mouth 2 (two) times daily.  60 tablet  11  . gemfibrozil (LOPID) 600 MG tablet Take 600 mg by mouth 2 (two) times daily.       [name] levothyroxine (SYNTHROID, LEVOTHROID) 75 MCG tablet Take 75 mcg by mouth daily.      . pantoprazole (PROTONIX) 40 MG tablet Take 40 mg by mouth daily.      . peg 3350 powder (MOVIPREP) 100 G SOLR Take 1 kit (200 g total) by mouth once.  1 kit  0  . predniSONE (DELTASONE) 5 MG tablet Take 5 mg by mouth daily.        No results found for this or any previous visit (from the past 48 hour(s)). No results found.  ROS  Blood pressure 153/85, pulse 104, temperature 97.4 F (36.3 C), temperature source Oral, resp. rate 20, height 5\' 1"  (1.549 m), weight 114 lb (51.71 kg), SpO2 98.00%. Physical Exam  Constitutional:  Well-developed thin African  American female in NAD  HENT:  Mouth/Throat: Oropharynx is clear and moist.  Eyes: Conjunctivae are normal. No scleral icterus.  Neck: No thyromegaly present.  Cardiovascular: Normal rate, regular rhythm and normal heart sounds.   No murmur heard. Respiratory: Effort normal and breath sounds normal.  GI: Soft. She exhibits no distension and no mass. There is no tenderness.  Musculoskeletal: She exhibits no edema.  Lymphadenopathy:    She has no cervical adenopathy.  Neurological: She is alert.  Skin: Skin is warm and dry.     Assessment/Plan Average risk screening colonoscopy.  REHMAN,NAJEEB U 10/07/2013, 12:22 PM

## 2013-10-12 ENCOUNTER — Encounter (HOSPITAL_COMMUNITY): Payer: Self-pay | Admitting: Internal Medicine

## 2013-10-16 ENCOUNTER — Encounter (INDEPENDENT_AMBULATORY_CARE_PROVIDER_SITE_OTHER): Payer: Self-pay | Admitting: *Deleted

## 2013-10-27 ENCOUNTER — Other Ambulatory Visit (INDEPENDENT_AMBULATORY_CARE_PROVIDER_SITE_OTHER): Payer: Self-pay | Admitting: Internal Medicine

## 2014-02-24 ENCOUNTER — Other Ambulatory Visit: Payer: Self-pay | Admitting: Internal Medicine

## 2014-03-04 ENCOUNTER — Other Ambulatory Visit: Payer: Self-pay | Admitting: Internal Medicine

## 2014-03-04 DIAGNOSIS — J8410 Pulmonary fibrosis, unspecified: Secondary | ICD-10-CM

## 2014-03-05 ENCOUNTER — Ambulatory Visit (INDEPENDENT_AMBULATORY_CARE_PROVIDER_SITE_OTHER): Payer: Medicare Other | Admitting: Internal Medicine

## 2014-03-05 ENCOUNTER — Encounter: Payer: Self-pay | Admitting: Internal Medicine

## 2014-03-05 VITALS — BP 130/90 | HR 99 | Temp 98.1°F | Ht 62.0 in | Wt 119.0 lb

## 2014-03-05 DIAGNOSIS — J8410 Pulmonary fibrosis, unspecified: Secondary | ICD-10-CM

## 2014-03-05 LAB — PULMONARY FUNCTION TEST
DL/VA % PRED: 91 %
DL/VA: 4.15 ml/min/mmHg/L
DLCO unc % pred: 58 %
DLCO unc: 12.71 ml/min/mmHg
FEF 25-75 POST: 2.01 L/s
FEF 25-75 PRE: 2.72 L/s
FEF2575-%Change-Post: -26 %
FEF2575-%PRED-PRE: 181 %
FEF2575-%Pred-Post: 133 %
FEV1-%Change-Post: 0 %
FEV1-%PRED-PRE: 103 %
FEV1-%Pred-Post: 103 %
FEV1-POST: 1.67 L
FEV1-PRE: 1.67 L
FEV1FVC-%CHANGE-POST: -1 %
FEV1FVC-%Pred-Pre: 108 %
FEV6-%CHANGE-POST: 2 %
FEV6-%PRED-POST: 101 %
FEV6-%Pred-Pre: 98 %
FEV6-Post: 2.03 L
FEV6-Pre: 1.98 L
FEV6FVC-%Change-Post: 0 %
FEV6FVC-%Pred-Post: 104 %
FEV6FVC-%Pred-Pre: 103 %
FVC-%CHANGE-POST: 1 %
FVC-%PRED-POST: 96 %
FVC-%Pred-Pre: 95 %
FVC-Post: 2.03 L
FVC-Pre: 2.01 L
POST FEV1/FVC RATIO: 82 %
PRE FEV1/FVC RATIO: 83 %
Post FEV6/FVC ratio: 100 %
Pre FEV6/FVC Ratio: 99 %
RV % pred: 41 %
RV: 0.88 L
TLC % PRED: 66 %
TLC: 3.15 L

## 2014-03-05 NOTE — Patient Instructions (Addendum)
Please schedule a follow up visit in 6 months but call sooner if needed with cxr on return 

## 2014-03-05 NOTE — Progress Notes (Signed)
Subjective:     Patient ID: Michelle Bonilla, female   DOB: 22-Apr-1941    MRN: [id number]   Brief patient profile:  25 yobf never smoked perfect health x hpb/ thyroid until Oct 2011 developed rash over chest some better with prednisone then hand swelling p stopped steroids  eval by Michelle Bonilla dx connective tissue dz so restarted on prednisone 04/2011 and referred to Pulmonary clinic by Dr Michelle Bonilla 05/2011 for ILD by CT  History of Present Illness  05/24/2011 Initial pulmonary office eval in EMR era  For ILD with no limiting sob or cough and good control of arthritis on prednisone at 5 mg three times a day.  Feels joint problems are much better. No unusual exposure hx or hx of taking chemo, amiodarone or macrodantin. rec return for pft's/cxr   07/05/2011 ov/Wert cc no significant limiting sob or cough, no flare of arthritis since on prednisone but considering mtx per Dr Michelle Bonilla.   - PFT's 07/05/2011 VC   1.75   74% ratio 89,  DLCO 49% corrects to 84   rec  Methotrexate's benefit is worth the risk to reduce prednisone need but does carry the risk of worsening your breathing and if this happens you first notice it on exertion and should call me    04/02/2012 f/u ov/Wert cc no change doe, not doing track due to weather but plans to start again, "not coughing" but clearing throat a lot since started on acei, attibutes this to "sinus drainage" but didn't have it before started acei. Arthritis good shape rec Please schedule a follow up visit in 3 months but call sooner if needed - consider d/c acei > done.   07/04/2012 f/u ov/Wert cc back to normal walking and throat problem completely cleared off acei, arthritis doing fine off prednisone for months s flare. rec  f/u prn   12/17/2012 f/u ov/Wert cc indolent onset progressive sense she's loosing ground with activity tol, fatigue since off prednisone and sev months dry cough despite off acei and on ppi daily.  Arthritis well controlled as mtx titrated  up rec Continue protonix 40 mg Take 30-60 min before first meal of the day  Prednisone 10 mg twice daily with meals and once better, one half twice daily   02/05/2013 f/u ov/Wert cc all smiles, overall much better cough gone, energy is main limiting problems, not sob, and no arthritis flare off mtx rec Change tenex to clonidine    07/27/2013 f/u ov/Wert re pf/ ra Chief Complaint  Patient presents with  . Follow-up    Pt states breathing is doing well and he denies any co's today.   Pred dose is 5 mg daily  Walking around track x 30 m rec  no change rx   03/05/2014 f/u ov/Wert re:  Chief Complaint  Patient presents with  . Followup with PFT    Pt states her breathing is doing well and denies any new co's today.   Not limited by breathing from desired activities    No obvious daytime variabilty or assoc chronic cough or cp or chest tightness, subjective wheeze overt sinus or hb symptoms. No unusual exp hx or h/o childhood pna/ asthma or knowledge of premature birth.         Sleeping ok without nocturnal  or early am exac of resp c/o's. Also denies any obvious fluctuation of symptoms with weather or environmental changes or other aggravating or alleviating factors.    Current Medications, Allergies, Complete Past Medical History, Past  Surgical History, Family History, and Social History were reviewed in [employer] record.  ROS  The following are not active complaints unless bolded sore throat, dysphagia, dental problems, itching, sneezing,  nasal congestion or excess/ purulent secretions, ear ache,   fever, chills, sweats, unintended wt loss, pleuritic or exertional cp, hemoptysis,  orthopnea pnd or leg swelling, presyncope, palpitations, heartburn, abdominal pain, anorexia, nausea, vomiting, diarrhea  or change in bowel or urinary habits, change in stools or urine, dysuria,hematuria,  rash, arthralgias, visual complaints, headache, numbness weakness or ataxia  or problems with walking or coordination,  change in mood/affect or memory.        [name]     PMHx Pulmonary Fibrosis/ Connective Tissue dz................Marland KitchenMarland KitchenGavin Bonilla       - onset Oct 2011 assoc with rash       - Prednisone started 05/2011        - CT Chest 05/09/11 Stable chronic interstitial opacities in the lung bases most  compatible with fibrosis.          Mildly prominent bilateral axillary lymph nodes, left slightly  greater than right. These are stable           Objective:   Physical Exam  Pleasant amb bf nad     Wt 102 05/24/11 >  114 07/05/11  > 121 08/22/2011  > 04/02/2012  109 > 07/04/2012  102 > 93 12/17/2012 > 104 02/05/2013 > 07/27/2013  114 > 03/05/2014 119  HEENT: nl dentition, turbinates, and orophanx. Nl external ear canals without cough reflex   NECK :  without JVD/Nodes/TM/ nl carotid upstrokes bilaterally   LUNGS: no acc muscle use, clear to A and P bilaterally without cough on insp or exp maneuvers. No sign crackles/minimal BV changes both bases   CV:  RRR  no s3 or murmur,   No increase in P2, no edema   ABD:  soft and nontender with nl excursion in the supine position. No bruits or organomegaly, bowel sounds nl  MS:  warm without deformities, calf tenderness, cyanosis.  No clubbing  SKIN: warm and dry without lesions / nodules          Assessment:

## 2014-03-05 NOTE — Progress Notes (Signed)
PFT done today. 

## 2014-03-06 NOTE — Assessment & Plan Note (Addendum)
-   onset Oct 2011 assoc with rash       - CT Chest 05/09/11 Stable chronic interstitial opacities in the lung bases most  compatible with fibrosis.          Mildly prominent bilateral axillary lymph nodes, left slightly  greater than right. These are stable          - 05/24/11 Walked 3 laps @ 185 ft each stopped due to  End of test sats down to 90 RA   but no symptoms       - 07/05/2011 Walked 2 laps @ 185 ft each stopped due to desat to 86% RA at rapid pace, no symptoms       - PFT's 07/05/2011 VC   1.75   74% ratio 89,  DLCO 49% corrects to 84        - PFT's 02/05/2013  VC   1.94  75% ratio 83 and DLCO 64% corrects 85        - Methotrexate rx 07/2011>>> 12/17/2012 ? mtx toxicity       -07/04/2012  Walked RA x 3 laps @ 185 ft each stopped due to  End of study, sats ok      - 12/17/2012  Walked RA  2 laps @ 185 ft each stopped due to  desat to 75%> restarted pred and d/c mtx      - 02/05/2013  Walked RA  2 laps @ 185 ft each stopped due to  Sat 89%      - 07/27/2013  Walked RA x 3 laps @ 185 ft each stopped due to sat 86%      - 03/05/2014 PFT's VC 2.10 (107) without airflow obst, 58% and corrects 91%       - 03/05/2014  Walked RA x 1 laps @ 185 ft each stopped due to desat 80% at fast pace    Despite improvement in lung vol and dlco still desats at rapid pace walking but overall doing well > key to prognosis is controlling the underlying collagen vasc process > defer to rheum and plan f/u here q 6 m, sooner prn

## 2014-03-25 ENCOUNTER — Other Ambulatory Visit: Payer: Self-pay | Admitting: Internal Medicine

## 2014-04-26 ENCOUNTER — Other Ambulatory Visit (INDEPENDENT_AMBULATORY_CARE_PROVIDER_SITE_OTHER): Payer: Self-pay | Admitting: Internal Medicine

## 2014-04-26 NOTE — Telephone Encounter (Signed)
Per Dr.Rehman may fill with 1 additional. Patient will need an office visit prior to further refills.

## 2014-06-21 ENCOUNTER — Other Ambulatory Visit: Payer: Self-pay

## 2014-06-21 DIAGNOSIS — Z1231 Encounter for screening mammogram for malignant neoplasm of breast: Secondary | ICD-10-CM

## 2014-06-24 ENCOUNTER — Other Ambulatory Visit (INDEPENDENT_AMBULATORY_CARE_PROVIDER_SITE_OTHER): Payer: Self-pay | Admitting: Internal Medicine

## 2014-06-24 NOTE — Telephone Encounter (Signed)
Per Dr.Rehman may fill with 5 refills 

## 2014-07-22 ENCOUNTER — Telehealth: Payer: Self-pay | Admitting: Internal Medicine

## 2014-07-22 DIAGNOSIS — J8410 Pulmonary fibrosis, unspecified: Secondary | ICD-10-CM

## 2014-07-22 NOTE — Telephone Encounter (Signed)
Called spoke with pt. Aware order placed for next visit CXR. Nothing further needed

## 2014-07-23 ENCOUNTER — Ambulatory Visit
Admission: RE | Admit: 2014-07-23 | Discharge: 2014-07-23 | Disposition: A | Discharge status: Home / Self Care | Payer: Medicare Other | Source: Ambulatory Visit

## 2014-07-23 ENCOUNTER — Encounter (INDEPENDENT_AMBULATORY_CARE_PROVIDER_SITE_OTHER): Payer: Self-pay

## 2014-07-23 DIAGNOSIS — Z1231 Encounter for screening mammogram for malignant neoplasm of breast: Secondary | ICD-10-CM

## 2014-08-12 ENCOUNTER — Emergency Department (HOSPITAL_COMMUNITY): Payer: Medicare Other

## 2014-08-12 ENCOUNTER — Emergency Department (HOSPITAL_COMMUNITY)
Admission: EM | Admit: 2014-08-12 | Discharge: 2014-08-12 | Disposition: A | Discharge status: Home / Self Care | Payer: Medicare Other | Attending: Emergency Medicine | Admitting: Emergency Medicine

## 2014-08-12 ENCOUNTER — Encounter (HOSPITAL_COMMUNITY): Payer: Self-pay | Admitting: Emergency Medicine

## 2014-08-12 ENCOUNTER — Ambulatory Visit (INDEPENDENT_AMBULATORY_CARE_PROVIDER_SITE_OTHER): Payer: Medicare Other | Admitting: Orthopedic Surgery

## 2014-08-12 VITALS — BP 147/101 | Ht 62.0 in | Wt 114.0 lb

## 2014-08-12 DIAGNOSIS — R296 Repeated falls: Secondary | ICD-10-CM | POA: Insufficient documentation

## 2014-08-12 DIAGNOSIS — S42209A Unspecified fracture of upper end of unspecified humerus, initial encounter for closed fracture: Secondary | ICD-10-CM

## 2014-08-12 DIAGNOSIS — Y929 Unspecified place or not applicable: Secondary | ICD-10-CM | POA: Insufficient documentation

## 2014-08-12 DIAGNOSIS — S42202A Unspecified fracture of upper end of left humerus, initial encounter for closed fracture: Secondary | ICD-10-CM

## 2014-08-12 DIAGNOSIS — S4980XA Other specified injuries of shoulder and upper arm, unspecified arm, initial encounter: Secondary | ICD-10-CM | POA: Insufficient documentation

## 2014-08-12 DIAGNOSIS — Y9389 Activity, other specified: Secondary | ICD-10-CM | POA: Insufficient documentation

## 2014-08-12 DIAGNOSIS — Z79899 Other long term (current) drug therapy: Secondary | ICD-10-CM | POA: Diagnosis not present

## 2014-08-12 DIAGNOSIS — S46909A Unspecified injury of unspecified muscle, fascia and tendon at shoulder and upper arm level, unspecified arm, initial encounter: Secondary | ICD-10-CM | POA: Insufficient documentation

## 2014-08-12 MED ORDER — HYDROCODONE-ACETAMINOPHEN 5-325 MG PO TABS
0.5000 | ORAL_TABLET | Freq: Once | ORAL | Status: AC
  Administered 2014-08-12: 0.5 via ORAL
  Filled 2014-08-12: qty 1

## 2014-08-12 MED ORDER — HYDROCODONE-ACETAMINOPHEN 5-325 MG PO TABS: 1.0000 | ORAL_TABLET | ORAL | Status: DC | PRN

## 2014-08-12 NOTE — ED Provider Notes (Signed)
CSN: [id number]     Arrival date & time 08/12/14  1257 History  This chart was scribed for Orpah Greek, * by Delphia Grates, ED Scribe. This patient was seen in room [facility] and the patient's care was started at 2:24 PM.    Chief Complaint  Patient presents with  . Fall     The history is provided by the patient. No language interpreter was used.    HPI Comments: NEGIN HEGG is a 73 y.o. female who presents to the Emergency Department complaining of fall that occurred PTA. Patient states she was playing with her grandchild and fell, landing on her left shoulder. She states she did not hit head and denies LOC. There is associated nonradiating left shoulder pain. Patient has taken 2 doses of non-extra strength Tylenol around 12 PM with temporary relief. Patient denies any other injuries. Patient has history of HTN, hypothyroid, PUD, and inflammatory arthritis. Patient is established with an orthopedist (Dr. Aline Brochure).  Past Medical History  Diagnosis Date  . Hypertension   . Hypothyroid 03/06/2011    per patient  . PUD (peptic ulcer disease)     Dr Dereck Leep  . Connective tissue disease   . Inflammatory arthritis    Past Surgical History  Procedure Laterality Date  . Colonoscopy    . Esophagogstroduodenoscopy    . Colonoscopy N/A 10/07/2013    Procedure: COLONOSCOPY;  Surgeon: Rogene Houston, MD;  Location: [facility];  Service: Endoscopy;  Laterality: N/A;  1200   Family History  Problem Relation Age of Onset  . Breast cancer Sister   . Bone cancer Sister    History  Substance Use Topics  . Smoking status: Never Smoker   . Smokeless tobacco: Never Used  . Alcohol Use: No   OB History   Grav Para Term Preterm Abortions TAB SAB Ect Mult Living                 Review of Systems  Constitutional: Negative for fever and chills.  Musculoskeletal: Positive for arthralgias (left shoulder).  Neurological: Negative for syncope.  All other systems reviewed  and are negative.     Allergies  Review of patient's allergies indicates no known allergies.  Home Medications   Prior to Admission medications   Medication Sig Start Date End Date Taking? Authorizing Provider  aspirin 325 MG tablet Take 650 mg by mouth every 6 (six) hours as needed for mild pain.    Yes Historical Provider, MD  atenolol (TENORMIN) 50 MG tablet Take 50 mg by mouth 2 (two) times daily.    Yes Historical Provider, MD  cloNIDine (CATAPRES) 0.1 MG tablet Take 0.1 mg by mouth 2 (two) times daily.   Yes Historical Provider, MD  gemfibrozil (LOPID) 600 MG tablet Take 600 mg by mouth 2 (two) times daily.  01/14/13  Yes Historical Provider, MD  levothyroxine (SYNTHROID, LEVOTHROID) 75 MCG tablet Take 75 mcg by mouth daily.   Yes Historical Provider, MD  pantoprazole (PROTONIX) 40 MG tablet Take 40 mg by mouth daily.   Yes Historical Provider, MD  predniSONE (DELTASONE) 5 MG tablet Take 5 mg by mouth daily.   Yes Historical Provider, MD   Triage Vitals: BP 138/100  Pulse 80  Temp(Src) 98 F (36.7 C) (Oral)  Resp 20  Ht 5\' 2"  (1.575 m)  Wt 114 lb (51.71 kg)  BMI 20.85 kg/m2  SpO2 100%  Physical Exam  Constitutional: She is oriented to person, place, and time.  She appears well-developed and well-nourished. No distress.  HENT:  Head: Normocephalic and atraumatic.  Right Ear: Hearing normal.  Left Ear: Hearing normal.  Nose: Nose normal.  Mouth/Throat: Oropharynx is clear and moist and mucous membranes are normal.  Eyes: Conjunctivae and EOM are normal. Pupils are equal, round, and reactive to light.  Neck: Normal range of motion. Neck supple.  Cardiovascular: Regular rhythm, S1 normal and S2 normal.  Exam reveals no gallop and no friction rub.   No murmur heard. Pulmonary/Chest: Effort normal and breath sounds normal. No respiratory distress. She exhibits no tenderness.  Abdominal: Soft. Normal appearance and bowel sounds are normal. There is no hepatosplenomegaly. There  is no tenderness. There is no rebound, no guarding, no tenderness at McBurney's point and negative Murphy's sign. No hernia.  Musculoskeletal: She exhibits tenderness.  Decreased ROM of left shoulder. Tenderness in proximal humerus area.  Neurological: She is alert and oriented to person, place, and time. She has normal strength. No cranial nerve deficit or sensory deficit. Coordination normal. GCS eye subscore is 4. GCS verbal subscore is 5. GCS motor subscore is 6.  Skin: Skin is warm, dry and intact. No rash noted. No cyanosis.  Psychiatric: She has a normal mood and affect. Her speech is normal and behavior is normal. Thought content normal.    ED Course  Procedures (including critical care time)  DIAGNOSTIC STUDIES: Oxygen Saturation is 100% on room air, normal by my interpretation.    COORDINATION OF CARE: At 9390 Discussed treatment plan with patient which includes shoulder sling and Vicodin. Patient agrees.   Labs Review Labs Reviewed - No data to display  Imaging Review Dg Shoulder Left  08/12/2014   CLINICAL DATA:  Left shoulder pain  EXAM: LEFT SHOULDER - 2+ VIEW  COMPARISON:  None.  FINDINGS: There is a mildly displaced fracture of the surgical neck of the left proximal humerus with a fracture cleft extending into the greater tuberosity. There is no glenohumeral dislocation. The acromioclavicular joint is normal.  IMPRESSION: Mildly displaced fracture of the surgical neck of the left proximal humerus with a fracture cleft extending to the greater tuberosity. No glenohumeral dislocation.   Electronically Signed   By: Kathreen Devoid   On: 08/12/2014 13:32     EKG Interpretation None      MDM   Final diagnoses:  Proximal humerus fracture, left, closed, initial encounter   As as to the ER for evaluation of left upper arm pain after a fall. Patient has isolated upper extremity injury. No head injury, no neck or back pain. Patient had x-ray which confirmed proximal humerus  fracture. Patient placed in a splint. She did not want any IM or IV pain medication. She was given half a Vicodin tablet for pain control. She is going straight to Doctor Harrison's office from the ER, by his request. He can manage pain and determine treatment plan.  I personally performed the services described in this documentation, which was scribed in my presence. The recorded information has been reviewed and is accurate.    Orpah Greek, MD 08/12/14 [id number]

## 2014-08-12 NOTE — Discharge Instructions (Signed)
Humerus Fracture, Treated with Immobilization  The humerus is the large bone in your upper arm. You have a broken (fractured) humerus. These fractures are easily diagnosed with X-rays.  TREATMENT   Simple fractures which will heal without disability are treated with simple immobilization. Immobilization means you will wear a cast, splint, or sling. You have a fracture which will do well with immobilization. The fracture will heal well simply by being held in a good position until it is stable enough to begin range of motion exercises. Do not take part in activities which would further injure your arm.   HOME CARE INSTRUCTIONS    Put ice on the injured area.   Put ice in a plastic bag.   Place a towel between your skin and the bag.   Leave the ice on for 15-20 minutes, 03-04 times a day.   If you have a cast:   Do not scratch the skin under the cast using sharp or pointed objects.   Check the skin around the cast every day. You may put lotion on any red or sore areas.   Keep your cast dry and clean.   If you have a splint:   Wear the splint as directed.   Keep your splint dry and clean.   You may loosen the elastic around the splint if your fingers become numb, tingle, or turn cold or blue.   If you have a sling:   Wear the sling as directed.   Do not put pressure on any part of your cast or splint until it is fully hardened.   Your cast or splint can be protected during bathing with a plastic bag. Do not lower the cast or splint into water.   Only take over-the-counter or prescription medicines for pain, discomfort, or fever as directed by your caregiver.   Do range of motion exercises as instructed by your caregiver.   Follow up as directed by your caregiver. This is very important in order to avoid permanent injury or disability and chronic pain.  SEEK IMMEDIATE MEDICAL CARE IF:    Your skin or nails in the injured arm turn blue or gray.   Your arm feels cold or numb.   You develop severe  pain in the injured arm.   You are having problems with the medicines you were given.  MAKE SURE YOU:    Understand these instructions.   Will watch your condition.   Will get help right away if you are not doing well or get worse.  Document Released: 03/25/2001 Document Revised: 03/10/2012 Document Reviewed: 01/31/2011  ExitCare Patient Information 2015 [employer]. This information is not intended to replace advice given to you by your health care provider. Make sure you discuss any questions you have with your health care provider.

## 2014-08-12 NOTE — ED Notes (Signed)
nad noted prior to dc. Dc instructions reviewed and explained. Pt to f/u and go to Dr. Aline Brochure office as planned.

## 2014-08-12 NOTE — Patient Instructions (Signed)
Keep sling on

## 2014-08-12 NOTE — ED Notes (Signed)
Pt reports was playing with her grandchild and fell.  C/O pain to left shoulder.

## 2014-08-13 DIAGNOSIS — S42202A Unspecified fracture of upper end of left humerus, initial encounter for closed fracture: Secondary | ICD-10-CM | POA: Insufficient documentation

## 2014-08-13 NOTE — Progress Notes (Signed)
Chief Complaint  Patient presents with  . Shoulder Injury    Left shoulder fracture, DOI 08-12-14.    HISTORY: 73 year-old female fell earlier today fell at home complains of pain in the left shoulder dull and throbbing fairly severe constant worse with movement  Seen in ER today x-rays show proximal humerus fracture with 3 main fracture fragments which are nondisplaced by the Neer criteria less than 45 angulation less than a centimeter displacement. Past Medical History  Diagnosis Date  . Hypertension   . Hypothyroid 03/06/2011    per patient  . PUD (peptic ulcer disease)     Dr Dereck Leep  . Connective tissue disease   . Inflammatory arthritis    Past Surgical History  Procedure Laterality Date  . Colonoscopy    . Esophagogstroduodenoscopy    . Colonoscopy N/A 10/07/2013    Procedure: COLONOSCOPY;  Surgeon: Rogene Houston, MD;  Location: [facility];  Service: Endoscopy;  Laterality: N/A;  1200    Review of systems has been recorded reviewed and signed and scanned into the chart  BP 147/101  Ht 5\' 2"  (1.575 m)  Wt 114 lb (51.71 kg)  BMI 20.85 kg/m2 She is well-developed well-nourished well-groomed oriented x3 mood normal gait normal  Mild swelling and tenderness proximal humerus shoulder elbow wrist and hand range of motion stability and strength normal skin in the left upper extremity normal good distal pulse normal sensation no lymphadenopathy reflexes and coordination tests upper extremity deferred  Lower extremities show normal gait pattern and normal alignment  X-rays show proximal humerus fracture withplacement mild angulation less than 45  Diagnosis left proximal humerus fracture  Plan shoulder sling with immobilizer and x-ray in a week x-ray serially for 3 weeks until either fracture heals or displaced is patient aware if displacement occurs fracture will need surgical fixation

## 2014-08-17 ENCOUNTER — Telehealth: Payer: Self-pay | Admitting: Orthopedic Surgery

## 2014-08-17 NOTE — Telephone Encounter (Signed)
Call received from patient, requesting pain medication refill, HYDROcodone-acetaminophen (NORCO/VICODIN) 5-325 MG - her follow-up appointment is Thursday, 08/19/14, states she has run out.  Asking if she should take Tylenol if needs to wait until her appointment?  Her ph# is [phone].

## 2014-08-18 ENCOUNTER — Other Ambulatory Visit: Payer: Self-pay | Admitting: *Deleted

## 2014-08-18 MED ORDER — HYDROCODONE-ACETAMINOPHEN 7.5-325 MG PO TABS: 1.0000 | ORAL_TABLET | ORAL | Status: DC | PRN

## 2014-08-18 NOTE — Telephone Encounter (Signed)
Prescription has been picked up.

## 2014-08-19 ENCOUNTER — Ambulatory Visit (INDEPENDENT_AMBULATORY_CARE_PROVIDER_SITE_OTHER): Payer: Self-pay | Admitting: Orthopedic Surgery

## 2014-08-19 ENCOUNTER — Ambulatory Visit (INDEPENDENT_AMBULATORY_CARE_PROVIDER_SITE_OTHER): Payer: Medicare Other

## 2014-08-19 ENCOUNTER — Telehealth: Payer: Self-pay | Admitting: Orthopedic Surgery

## 2014-08-19 ENCOUNTER — Encounter: Payer: Self-pay | Admitting: Orthopedic Surgery

## 2014-08-19 VITALS — BP 141/117 | Ht 62.0 in | Wt 114.0 lb

## 2014-08-19 DIAGNOSIS — S42309D Unspecified fracture of shaft of humerus, unspecified arm, subsequent encounter for fracture with routine healing: Secondary | ICD-10-CM

## 2014-08-19 DIAGNOSIS — S42202D Unspecified fracture of upper end of left humerus, subsequent encounter for fracture with routine healing: Secondary | ICD-10-CM

## 2014-08-19 NOTE — Progress Notes (Signed)
Chief Complaint  Patient presents with  . Follow-up    1 week recheck on left shoulder fracture. DOI 08-12-14.    BP 141/117  Ht 5\' 2"  (1.575 m)  Wt 114 lb (51.71 kg)  BMI 20.85 kg/m2  Encounter Diagnosis  Name Primary?  . Proximal humerus fracture, left, with routine healing, subsequent encounter Yes    Second visit status post fracture left shoulder proximal humerus treated with sling-and-swathe.  Patient is on Norco 7.5 mg with good relief  X-ray show stable fracture  X-ray again in one week  Continue current symptoms hydrocodone as needed for pain.

## 2014-08-19 NOTE — Telephone Encounter (Signed)
As discussed with Dr Aline Brochure at patient's office visit today, 08/19/14, patient requests to designate daughter, Michelle Bonilla her power of attorney, to pick up any prescriptions for medication refill. Designated party release form on file.

## 2014-08-19 NOTE — Patient Instructions (Signed)
Continue sling

## 2014-08-25 ENCOUNTER — Ambulatory Visit (INDEPENDENT_AMBULATORY_CARE_PROVIDER_SITE_OTHER): Payer: Self-pay | Admitting: Orthopedic Surgery

## 2014-08-25 ENCOUNTER — Ambulatory Visit (INDEPENDENT_AMBULATORY_CARE_PROVIDER_SITE_OTHER): Payer: Medicare Other

## 2014-08-25 VITALS — Ht 62.0 in | Wt 114.0 lb

## 2014-08-25 DIAGNOSIS — S42202D Unspecified fracture of upper end of left humerus, subsequent encounter for fracture with routine healing: Secondary | ICD-10-CM

## 2014-08-25 DIAGNOSIS — S42309D Unspecified fracture of shaft of humerus, unspecified arm, subsequent encounter for fracture with routine healing: Secondary | ICD-10-CM

## 2014-08-25 NOTE — Patient Instructions (Signed)
Call to arrange therapy 

## 2014-08-26 ENCOUNTER — Encounter: Payer: Self-pay | Admitting: Orthopedic Surgery

## 2014-08-26 NOTE — Progress Notes (Signed)
Chief Complaint  Patient presents with  . Follow-up    recheck and xray Left shoulder fx, DOI 08/12/14   Ht 5\' 2"  (1.575 m)  Wt 114 lb (51.71 kg)  BMI 20.85 kg/m2 Encounter Diagnosis  Name Primary?  . Proximal humerus fracture, left, with routine healing, subsequent encounter Yes    Two-week followup x-ray left proximal humerus fracture. Treated with sling immobilizer. Patient's pain is much less she's not taking much pain medicine at this point she's neurovascularly intact she has mild tenderness over the proximal humerus and her x-ray shows no displacement  Recommend physical therapy and start weaning sling over the next few weeks  Followup 6 weeks

## 2014-08-31 ENCOUNTER — Ambulatory Visit (HOSPITAL_COMMUNITY)
Admission: RE | Admit: 2014-08-31 | Discharge: 2014-08-31 | Disposition: A | Discharge status: Home / Self Care | Payer: Medicare Other | Source: Ambulatory Visit | Attending: Internal Medicine | Admitting: Internal Medicine

## 2014-08-31 DIAGNOSIS — M25612 Stiffness of left shoulder, not elsewhere classified: Secondary | ICD-10-CM | POA: Insufficient documentation

## 2014-08-31 DIAGNOSIS — M6281 Muscle weakness (generalized): Secondary | ICD-10-CM | POA: Diagnosis not present

## 2014-08-31 DIAGNOSIS — M79609 Pain in unspecified limb: Secondary | ICD-10-CM | POA: Diagnosis not present

## 2014-08-31 DIAGNOSIS — I10 Essential (primary) hypertension: Secondary | ICD-10-CM | POA: Diagnosis not present

## 2014-08-31 DIAGNOSIS — M25619 Stiffness of unspecified shoulder, not elsewhere classified: Secondary | ICD-10-CM | POA: Diagnosis not present

## 2014-08-31 DIAGNOSIS — IMO0001 Reserved for inherently not codable concepts without codable children: Secondary | ICD-10-CM | POA: Diagnosis not present

## 2014-08-31 DIAGNOSIS — M25512 Pain in left shoulder: Secondary | ICD-10-CM

## 2014-08-31 DIAGNOSIS — M25519 Pain in unspecified shoulder: Secondary | ICD-10-CM | POA: Insufficient documentation

## 2014-08-31 NOTE — Evaluation (Addendum)
Occupational Therapy Evaluation  Patient Details  Name: Michelle Bonilla MRN: [id number] Date of Birth: 02-27-1941  Today's Date: 08/31/2014 Time: 0388-8280 OT Time Calculation (min): 35 min Eval: 0349-1791 34' MFR: 5056-9794 15'  Visit#: 1 of 16  Re-eval: 09/28/14  Assessment Diagnosis: left proximal humerus fracture Next MD Visit: 10/07/14 Aline Brochure Prior Therapy: None   Past Medical History:  Past Medical History  Diagnosis Date  . Hypertension   . Hypothyroid 03/06/2011    per patient  . PUD (peptic ulcer disease)     Dr Dereck Leep  . Connective tissue disease   . Inflammatory arthritis    Past Surgical History:  Past Surgical History  Procedure Laterality Date  . Colonoscopy    . Esophagogstroduodenoscopy    . Colonoscopy N/A 10/07/2013    Procedure: COLONOSCOPY;  Surgeon: Rogene Houston, MD;  Location: [facility];  Service: Endoscopy;  Laterality: N/A;  1200    Subjective Symptoms/Limitations Symptoms: S: It hurts when I move it.  Pertinent History: 08/12/14 patient was playing with grandson and he pushed her and she fell on left arm.  Patient wore a sling for two weeks post fall.  No surgery warranted.  Patient was referred to occupational therapy by Dr. Aline Brochure for evaluation and treatment.  Limitations: PROM for four weeks (09/28/14). Progress to AAROM. Progress as tolerated.  Special Tests: FOTO score 41/100.   Patient Stated Goals: To get arm back the way it was.  Pain Assessment Currently in Pain?: Yes Pain Score: 5  Pain Location: Shoulder Pain Orientation: Right Pain Type: Acute pain Pain Relieving Factors: pain meds  Precautions/Restrictions  Precautions Precautions: Shoulder Type of Shoulder Precautions: PROM for 4 weeks (09-28-14). Progress to AAROM. Progress as tolerated  Balance Screening Balance Screen Has the patient fallen in the past 6 months: Yes How many times?: 1 Has the patient had a decrease in activity level because of a fear of  falling? : No Is the patient reluctant to leave their home because of a fear of falling? : No  Prior [place] expects to be discharged to:: Private residence Living Arrangements: Alone Prior Function Level of Independence: Independent with basic ADLs;Independent with gait  Able to Take Stairs?: Yes Driving: No Vocation: Retired Comments: Enjoys spending time with grandchildren. One granddaughter and two grandsons.   Assessment ADL/Vision/Perception ADL ADL Comments: Difficulty with self-feeding. reaching up overhead, lifting heavy items.Difficulty writing.. Dominant Hand: Left Vision - History Baseline Vision: No visual deficits  Cognition/Observation Cognition Overall Cognitive Status: Within Functional Limits for tasks assessed Arousal/Alertness: Awake/alert Orientation Level: Oriented X4  Sensation/Coordination/Edema Edema Edema: Moderate amount of edema present in left upper arm. Minimal amount distal to elbow.   Additional Assessments RUE Assessment RUE Assessment: Within Functional Limits LUE Assessment LUE Assessment:  (assessed supine. IR/ER adducted) LUE PROM (degrees) Left Shoulder Flexion: 99 Degrees Left Shoulder ABduction: 65 Degrees Left Shoulder Internal Rotation: 83 Degrees Left Shoulder External Rotation: 16 Degrees LUE Strength LUE Overall Strength Comments: Not assessed this date Palpation Palpation: Mod amount of fascial restrictions in left upper arm, trapezius, and scapularis region.      Exercise/Treatments Supine Protraction: PROM;5 reps Horizontal ABduction: PROM;5 reps External Rotation: PROM;5 reps Internal Rotation: PROM;5 reps Flexion: PROM;5 reps ABduction: PROM;5 reps         Manual Therapy Manual Therapy: Myofascial release Myofascial Release: Myofascial release and manual stretching to left upper arm, trapezius, and scapularis region to decrease fascial restrictions increase joint mobility in  a pain free zone.   Occupational Therapy Assessment and Plan OT Assessment and Plan Clinical Impression Statement: A: Patient is a 73 year old female s/p left proximal humerus fracture causing increased pain and fascial restrictions and decreased strength and joint mobility resulting in difficulty completing daily activities.  Pt will benefit from skilled therapeutic intervention in order to improve on the following deficits: Increased fascial restricitons;Pain;Impaired UE functional use;Decreased strength;Decreased range of motion;Increased edema Rehab Potential: Excellent OT Frequency: Min 2X/week OT Duration: 8 weeks OT Treatment/Interventions: Self-care/ADL training;Therapeutic exercise;Therapeutic activities;Manual therapy;Patient/family education;Modalities OT Plan: P: Pt will benefit from skilled OT interventions in order to decrease pain, increase ROM, increase strength, and increase overall LUE functional use. Treatment Plan: PROM, AAROM, and AROM, MFR and manual stretching, scapular strengtheing and proximal stabilization, LUE general strengthening.  Test grip and pinch strength, make goals if needed.    Goals Short Term Goals Time to Complete Short Term Goals: 4 weeks Short Term Goal 1: Patient will be educated on HEP.  Short Term Goal 2: Patient will decrease pain to 3/10 during daily tasks.  Short Term Goal 3: Patient will increase PROM to [facility] to increase ability to reach item at waist level.  Short Term Goal 4: Patient will decrease fascial restrictions from mod to min amount.  Short Term Goal 5: Patient will increase strength to 3/5 to increase ability to self-feed with less difficulty.  Long Term Goals Time to Complete Long Term Goals: 8 weeks Long Term Goal 1: Patient will return to highest level of functional independence using LUE during daily and lesiure tasks.  Long Term Goal 2: Patient will decrease pain to 1/10 or less during play activities with grandchildren.  Long  Term Goal 3: Patient will increase AROM to WNL to increase ability to reach into overhead cabinets.  Long Term Goal 4: Patient will decrease fascial restrictions from min to trace amounts.  Long Term Goal 5: Patient will increase strength to 4/5 to increase ability to lift heavy items.   Problem List Patient Active Problem List   Diagnosis Date Noted  . Pain in joint, shoulder region 08/31/2014  . Decreased range of motion of left shoulder 08/31/2014  . Muscle weakness (generalized) 08/31/2014  . Fracture of proximal end of left humerus 08/13/2014  . SIRS (systemic inflammatory response syndrome) 03/05/2013  . Acute gastroenteritis 03/05/2013  . Sinus tachycardia 03/05/2013  . Acute encephalopathy 03/04/2013  . Fever 03/04/2013  . HBP (high blood pressure) 04/02/2012  . Pulmonary fibrosis assoc with RA 05/24/2011    End of Session Activity Tolerance: Patient tolerated treatment well General Behavior During Therapy: [facility] for tasks assessed/performed OT Plan of Care OT Home Exercise Plan: Towel slides OT Patient Instructions: handout (scanned) Consulted and Agree with Plan of Care: Patient  GO  Functional Assessment Tool Used: FOTO score: 41/100 (59% impaired)  Functional Limitation: Carrying, moving and handling objects  Carrying, Moving and Handling Objects Current Status (E0923): At least 40 percent but less than 60 percent impaired, limited or restricted  Carrying, Moving and Handling Objects Goal Status [phone]): At least 1 percent but less than 20 percent impaired, limited or restricted    Ailene Ravel, OTR/L,CBIS   08/31/2014, 11:57 AM  Physician Documentation Your signature is required to indicate approval of the treatment plan as stated above.  Please sign and either send electronically or make a copy of this report for your files and return this physician signed original.  Please mark one 1.__approve of plan  2.  ___approve of plan with the following  conditions.   ______________________________                                                          _____________________ Physician Signature                                                                                                             Date SBNR

## 2014-09-01 ENCOUNTER — Ambulatory Visit (HOSPITAL_COMMUNITY)
Admission: RE | Admit: 2014-09-01 | Discharge: 2014-09-01 | Disposition: A | Discharge status: Home / Self Care | Payer: Medicare Other | Source: Ambulatory Visit | Attending: Internal Medicine | Admitting: Internal Medicine

## 2014-09-01 DIAGNOSIS — IMO0001 Reserved for inherently not codable concepts without codable children: Secondary | ICD-10-CM | POA: Diagnosis not present

## 2014-09-01 NOTE — Progress Notes (Signed)
Occupational Therapy Treatment Patient Details  Name: Michelle Bonilla MRN: [id number] Date of Birth: 20-Apr-1941  Today's Date: 09/01/2014 Time: 8144-8185 OT Time Calculation (min): 43 min MFR: 6314-9702 25' Therex: 6378-5885 18'   Visit#: 2 of 16  Re-eval: 09/28/14    Subjective Symptoms/Limitations Symptoms: It's doing ok, the ice has really helped since you all told me about that.  Pain Assessment Currently in Pain?: No/denies  Precautions/Restrictions  Precautions Precautions: Shoulder Type of Shoulder Precautions: PROM for 4 weeks (09-28-14). Progress to AAROM. Progress as tolerated  Exercise/Treatments Supine Protraction: PROM;10 reps Horizontal ABduction: PROM;10 reps External Rotation: PROM;10 reps Internal Rotation: PROM;10 reps Flexion: PROM;10 reps ABduction: PROM;10 reps Seated Elevation: AROM;10 reps Extension: AROM;10 reps Row: AROM;10 reps          Manual Therapy Manual Therapy: Myofascial release Myofascial Release: Myofascial release and manual stretching to left upper arm, trapezius, and scapularis region to decrease fascial restrictions increase joint mobility in a pain free zone  Occupational Therapy Assessment and Plan OT Assessment and Plan Clinical Impression Statement: A: Initiated myfascial release, manual stretching, and seated shoulder exercises-elevation, extension, and row.  Patient tolerated well. Patient required cuing to depress shoulders during seated exercises.   OT Plan: P:   Add seated therapy ball exercises-flexion and abduction. Assess grip and pinch strength.    Goals Short Term Goals Time to Complete Short Term Goals: 4 weeks Short Term Goal 1: Patient will be educated on HEP.  Short Term Goal 1 Progress: Progressing toward goal Short Term Goal 2: Patient will decrease pain to 3/10 during daily tasks.  Short Term Goal 2 Progress: Progressing toward goal Short Term Goal 3: Patient will increase PROM to [facility] to increase  ability to reach item at waist level.  Short Term Goal 3 Progress: Progressing toward goal Short Term Goal 4: Patient will decrease fascial restrictions from mod to min amount.  Short Term Goal 4 Progress: Progressing toward goal Short Term Goal 5: Patient will increase strength to 3/5 to increase ability to self-feed with less difficulty.  Short Term Goal 5 Progress: Progressing toward goal Long Term Goals Time to Complete Long Term Goals: 8 weeks Long Term Goal 1: Patient will return to highest level of functional independence using LUE during daily and lesiure tasks.  Long Term Goal 1 Progress: Progressing toward goal Long Term Goal 2: Patient will decrease pain to 1/10 or less during play activities with grandchildren.  Long Term Goal 2 Progress: Progressing toward goal Long Term Goal 3: Patient will increase AROM to WNL to increase ability to reach into overhead cabinets.  Long Term Goal 3 Progress: Progressing toward goal Long Term Goal 4: Patient will decrease fascial restrictions from min to trace amounts.  Long Term Goal 4 Progress: Progressing toward goal Long Term Goal 5: Patient will increase strength to 4/5 to increase ability to lift heavy items.  Long Term Goal 5 Progress: Progressing toward goal  Problem List Patient Active Problem List   Diagnosis Date Noted  . Pain in joint, shoulder region 08/31/2014  . Decreased range of motion of left shoulder 08/31/2014  . Muscle weakness (generalized) 08/31/2014  . Fracture of proximal end of left humerus 08/13/2014  . SIRS (systemic inflammatory response syndrome) 03/05/2013  . Acute gastroenteritis 03/05/2013  . Sinus tachycardia 03/05/2013  . Acute encephalopathy 03/04/2013  . Fever 03/04/2013  . HBP (high blood pressure) 04/02/2012  . Pulmonary fibrosis assoc with RA 05/24/2011    End of Session Activity Tolerance: Patient  tolerated treatment well General Behavior During Therapy: [facility] for tasks  assessed/performed   Ailene Ravel, OTR/L,CBIS   09/01/2014, 11:59 AM

## 2014-09-07 ENCOUNTER — Ambulatory Visit (HOSPITAL_COMMUNITY)
Admission: RE | Admit: 2014-09-07 | Discharge: 2014-09-07 | Disposition: A | Discharge status: Home / Self Care | Payer: Medicare Other | Source: Ambulatory Visit | Attending: Orthopedic Surgery | Admitting: Orthopedic Surgery

## 2014-09-07 DIAGNOSIS — IMO0001 Reserved for inherently not codable concepts without codable children: Secondary | ICD-10-CM | POA: Diagnosis not present

## 2014-09-07 NOTE — Progress Notes (Addendum)
Occupational Therapy Treatment Patient Details  Name: Michelle Bonilla MRN: [id number] Date of Birth: May 29, 1941  Today's Date: 09/07/2014 Time: 6789-3810 OT Time Calculation (min): 29 min Grip and pinch assessment: 1751-0258 5' MFR: 5277-8242 14' Therex: 3536-1443 10'  Visit#: 3 of 16  Re-eval: 09/28/14    Authorization: BCBS Medicare  Authorization Time Period: before 10th visit   Authorization Visit#: 2 of 10  Subjective Symptoms/Limitations Symptoms: A: It's ok, I don't have any pain until I start moving it.  Pain Assessment Currently in Pain?: No/denies  Precautions/Restrictions  Precautions Precautions: Shoulder Type of Shoulder Precautions: PROM for 4 weeks (09-28-14). Progress to AAROM. Progress as tolerated  Exercise/Treatments Supine Protraction: PROM;5 reps Horizontal ABduction: PROM;5 reps External Rotation: PROM;5 reps Internal Rotation: PROM;5 reps Flexion: PROM;5 reps ABduction: PROM;5 reps Other Supine Exercises: Bridges 12X Seated Elevation: AROM;10 reps Extension: AROM;10 reps Row: AROM;10 reps Other Seated Exercises: Elbow flexion/extension; wrist flexion/extension 10X Other Seated Exercises: Forearm supination/pronation 10X     Therapy Ball Flexion: 15 reps ABduction: 15 reps       Manual Therapy Manual Therapy: Myofascial release Myofascial Release: Myofascial release and manual stretching to left upper arm, trapezius, and scapularis region to decrease fascial restrictions increase joint mobility in a pain free zone  Occupational Therapy Assessment and Plan OT Assessment and Plan Clinical Impression Statement: Assessed grip and pinch strength, left grip strength: 35#, lateral pinch strength: 13#, 3 point pinch: 10#. Long term goal created to address grip and pinch strength. Added bridges, therapy ball exercises. Patient tolerated well. Patient required cuing to relax arm/shoulder during PROM exercises OT Plan: P: Add isometric exercises in  supine.    Goals Short Term Goals Time to Complete Short Term Goals: 4 weeks Short Term Goal 1: Patient will be educated on HEP.  Short Term Goal 1 Progress: Progressing toward goal Short Term Goal 2: Patient will decrease pain to 3/10 during daily tasks.  Short Term Goal 2 Progress: Progressing toward goal Short Term Goal 3: Patient will increase PROM to [facility] to increase ability to reach item at waist level.  Short Term Goal 3 Progress: Progressing toward goal Short Term Goal 4: Patient will decrease fascial restrictions from mod to min amount.  Short Term Goal 4 Progress: Progressing toward goal Short Term Goal 5: Patient will increase strength to 3/5 to increase ability to self-feed with less difficulty.  Short Term Goal 5 Progress: Progressing toward goal Long Term Goals Time to Complete Long Term Goals: 8 weeks Long Term Goal 1: Patient will return to highest level of functional independence using LUE during daily and lesiure tasks.  Long Term Goal 1 Progress: Progressing toward goal Long Term Goal 2: Patient will decrease pain to 1/10 or less during play activities with grandchildren.  Long Term Goal 2 Progress: Progressing toward goal Long Term Goal 3: Patient will increase AROM to WNL to increase ability to reach into overhead cabinets.  Long Term Goal 3 Progress: Progressing toward goal Long Term Goal 4: Patient will decrease fascial restrictions from min to trace amounts.  Long Term Goal 4 Progress: Progressing toward goal Long Term Goal 5: Patient will increase strength to 4/5 to increase ability to lift heavy items.  Long Term Goal 5 Progress: Progressing toward goal Additional Long Term Goals?: Yes Long Term Goal 6: patient will increase left hand grip strength by 10# to increase ability to hold onto items with less difficulty.  Long Term Goal 6 Progress: Progressing toward goal  Problem List Patient  Active Problem List   Diagnosis Date Noted  . Pain in joint, shoulder  region 08/31/2014  . Decreased range of motion of left shoulder 08/31/2014  . Muscle weakness (generalized) 08/31/2014  . Fracture of proximal end of left humerus 08/13/2014  . SIRS (systemic inflammatory response syndrome) 03/05/2013  . Acute gastroenteritis 03/05/2013  . Sinus tachycardia 03/05/2013  . Acute encephalopathy 03/04/2013  . Fever 03/04/2013  . HBP (high blood pressure) 04/02/2012  . Pulmonary fibrosis assoc with RA 05/24/2011    End of Session Activity Tolerance: Patient tolerated treatment well General Behavior During Therapy: WFL for tasks assessed/performed  GO Functional Assessment Tool Used: FOTO score: 41/100 (59% impaired) Functional Limitation: Carrying, moving and handling objects Carrying, Moving and Handling Objects Current Status (B0175): At least 40 percent but less than 60 percent impaired, limited or restricted Carrying, Moving and Handling Objects Goal Status [phone]): At least 1 percent but less than 20 percent impaired, limited or restricted  Ailene Ravel, OTR/L,CBIS   09/07/2014, 5:03 PM

## 2014-09-08 ENCOUNTER — Encounter: Payer: Self-pay | Admitting: Internal Medicine

## 2014-09-08 ENCOUNTER — Ambulatory Visit (INDEPENDENT_AMBULATORY_CARE_PROVIDER_SITE_OTHER)
Admission: RE | Admit: 2014-09-08 | Discharge: 2014-09-08 | Disposition: A | Discharge status: Home / Self Care | Payer: Medicare Other | Source: Ambulatory Visit | Attending: Internal Medicine | Admitting: Internal Medicine

## 2014-09-08 ENCOUNTER — Ambulatory Visit (INDEPENDENT_AMBULATORY_CARE_PROVIDER_SITE_OTHER): Payer: Medicare Other | Admitting: Internal Medicine

## 2014-09-08 VITALS — BP 134/84 | HR 79 | Temp 98.1°F | Ht 62.0 in | Wt 114.4 lb

## 2014-09-08 DIAGNOSIS — J8410 Pulmonary fibrosis, unspecified: Secondary | ICD-10-CM

## 2014-09-08 NOTE — Patient Instructions (Addendum)
Return in one year for cxr and pfts unless losing ground with exercise tolerance

## 2014-09-08 NOTE — Assessment & Plan Note (Addendum)
-   onset Oct 2011 assoc with rash       - CT Chest 05/09/11 Stable chronic interstitial opacities in the lung bases most  compatible with fibrosis.          Mildly prominent bilateral axillary lymph nodes, left slightly  greater than right. These are stable          - 05/24/11 Walked 3 laps @ 185 ft each stopped due to  End of test sats down to 90 RA   but no symptoms       - 07/05/2011 Walked 2 laps @ 185 ft each stopped due to desat to 86% RA at rapid pace, no symptoms       - PFT's 07/05/2011 VC   1.75   74% ratio 89,  DLCO 49% corrects to 84        - PFT's 02/05/2013  VC   1.94  75% ratio 83 and DLCO 64% corrects 85        - Methotrexate rx 07/2011>>> 12/17/2012 ? mtx toxicity       -07/04/2012  Walked RA x 3 laps @ 185 ft each stopped due to  End of study, sats ok      - 12/17/2012  Walked RA  2 laps @ 185 ft each stopped due to  desat to 75%> restarted pred and d/c mtx      - 02/05/2013  Walked RA  2 laps @ 185 ft each stopped due to  Sat 89%      - 07/27/2013  Walked RA x 3 laps @ 185 ft each stopped due to sat 86%      - 03/05/2014 PFT's VC 2.10 (107) without airflow obst, 58% and corrects 91%       - 03/05/2014  Walked RA x 1 laps @ 185 ft each stopped due to desat 80% at fast pace        - 09/08/2014  [facility] RA @ rapid pace x 3 laps @ 185 ft each stopped due to end of study, no desat, no sob   No progression of ILD on pred rx per Dr Trudie Reed > f/u yearly is fine     Each maintenance medication was reviewed in detail including most importantly the difference between maintenance and as needed and under what circumstances the prns are to be used.  Please see instructions for details which were reviewed in writing and the patient given a copy.

## 2014-09-08 NOTE — Progress Notes (Signed)
Subjective:     Patient ID: Michelle Bonilla, female   DOB: 1941-10-09    MRN: [id number]   Brief patient profile:  16 yobf never smoked perfect health x hpb/ thyroid until Oct 2011 developed rash over chest some better with prednisone then hand swelling p stopped steroids  eval by Trudie Reed dx connective tissue dz so restarted on prednisone 04/2011 and referred to Pulmonary clinic by Dr Trudie Reed 05/2011 for ILD by CT  History of Present Illness  05/24/2011 Initial pulmonary office eval in EMR era  For ILD with no limiting sob or cough and good control of arthritis on prednisone at 5 mg three times a day.  Feels joint problems are much better. No unusual exposure hx or hx of taking chemo, amiodarone or macrodantin. rec return for pft's/cxr   07/05/2011 ov/Eris Hannan cc no significant limiting sob or cough, no flare of arthritis since on prednisone but considering mtx per Dr Trudie Reed.   - PFT's 07/05/2011 VC   1.75   74% ratio 89,  DLCO 49% corrects to 84   rec  Methotrexate's benefit is worth the risk to reduce prednisone need but does carry the risk of worsening your breathing and if this happens you first notice it on exertion and should call me    04/02/2012 f/u ov/Orel Hord cc no change doe, not doing track due to weather but plans to start again, "not coughing" but clearing throat a lot since started on acei, attibutes this to "sinus drainage" but didn't have it before started acei. Arthritis good shape rec Please schedule a follow up visit in 3 months but call sooner if needed - consider d/c acei > done.   07/04/2012 f/u ov/Mirella Gueye cc back to normal walking and throat problem completely cleared off acei, arthritis doing fine off prednisone for months s flare. rec  f/u prn   12/17/2012 f/u ov/Gael Delude cc indolent onset progressive sense she's loosing ground with activity tol, fatigue since off prednisone and sev months dry cough despite off acei and on ppi daily.  Arthritis well controlled as mtx titrated  up rec Continue protonix 40 mg Take 30-60 min before first meal of the day  Prednisone 10 mg twice daily with meals and once better, one half twice daily   02/05/2013 f/u ov/Lanorris Kalisz cc all smiles, overall much better cough gone, energy is main limiting problems, not sob, and no arthritis flare off mtx rec Change tenex to clonidine    07/27/2013 f/u ov/Tajon Moring re pf/ ra Chief Complaint  Patient presents with  . Follow-up    Pt states breathing is doing well and he denies any co's today.   Pred dose is 5 mg daily  Walking around track x 30 m rec  no change rx   03/05/2014 f/u ov/Rashonda Warrior re:  Pf assoc with connective tissue dz  Chief Complaint  Patient presents with  . Followup with PFT    Pt states her breathing is doing well and denies any new co's today.   Not limited by breathing from desired activities   rec Recheck in 6 m    09/08/2014 f/u ov/Edwardine Deschepper re:  Pf assoc with connective tissue dz / still @ 5 mg pred / day Chief Complaint  Patient presents with  . Follow-up    Pt states that her breathing is doing well and denies any co's today.  until fx L shoulder 08/12/14 was walking 2h daily s limiting sob but much less active since then, treated with cast only and better  now  No obvious daytime variabilty or assoc chronic cough or cp or chest tightness, subjective wheeze overt sinus or hb symptoms. No unusual exp hx or h/o childhood pna/ asthma or knowledge of premature birth.       Sleeping ok without nocturnal  or early am exac of resp c/o's. Also denies any obvious fluctuation of symptoms with weather or environmental changes or other aggravating or alleviating factors.    Current Medications, Allergies, Complete Past Medical History, Past Surgical History, Family History, and Social History were reviewed in [employer] record.  ROS  The following are not active complaints unless bolded sore throat, dysphagia, dental problems, itching, sneezing,  nasal  congestion or excess/ purulent secretions, ear ache,   fever, chills, sweats, unintended wt loss, pleuritic or exertional cp, hemoptysis,  orthopnea pnd or leg swelling, presyncope, palpitations, heartburn, abdominal pain, anorexia, nausea, vomiting, diarrhea  or change in bowel or urinary habits, change in stools or urine, dysuria,hematuria,  rash, arthralgias, visual complaints, headache, numbness weakness or ataxia or problems with walking or coordination,  change in mood/affect or memory.        [name]     PMHx Pulmonary Fibrosis/ Connective Tissue dz................[name]       - onset Oct 2011 assoc with rash       - Prednisone started 05/2011        - CT Chest 05/09/11 Stable chronic interstitial opacities in the lung bases most  compatible with fibrosis.          Mildly prominent bilateral axillary lymph nodes, left slightly  greater than right. These are stable           Objective:   Physical Exam  Pleasant amb bf nad     Wt 102 05/24/11 >  114 07/05/11  > 121 08/22/2011  > 04/02/2012  109 > 07/04/2012  102 > 93 12/17/2012 > 104 02/05/2013 > 07/27/2013  114 > 03/05/2014 119> 09/08/2014 114   HEENT: nl dentition, turbinates, and orophanx. Nl external ear canals without cough reflex   NECK :  without JVD/Nodes/TM/ nl carotid upstrokes bilaterally   LUNGS: no acc muscle use, clear to A and P bilaterally without cough on insp or exp maneuvers. No sign crackles/minimal BV changes both bases   CV:  RRR  no s3 or murmur,   No increase in P2, no edema   ABD:  soft and nontender with nl excursion in the supine position. No bruits or organomegaly, bowel sounds nl  MS:  warm without deformities, calf tenderness, cyanosis.  No clubbing  SKIN: warm and dry without lesions / nodules        CXR  09/08/2014 :  Bibasilar interstitial fibrosis, stable. No new opacity. No edema or consolidation.   Assessment:

## 2014-09-09 NOTE — Progress Notes (Signed)
Quick Note:  Spoke with pt and notified of results per Dr. Wert. Pt verbalized understanding and denied any questions.  ______ 

## 2014-09-14 ENCOUNTER — Ambulatory Visit (HOSPITAL_COMMUNITY)
Admission: RE | Admit: 2014-09-14 | Discharge: 2014-09-14 | Disposition: A | Discharge status: Home / Self Care | Payer: Medicare Other | Source: Ambulatory Visit | Attending: Internal Medicine | Admitting: Internal Medicine

## 2014-09-14 DIAGNOSIS — IMO0001 Reserved for inherently not codable concepts without codable children: Secondary | ICD-10-CM | POA: Diagnosis not present

## 2014-09-14 NOTE — Progress Notes (Signed)
Occupational Therapy Treatment Patient Details  Name: Michelle Bonilla MRN: [id number] Date of Birth: [birth year]  Today's Date: 09/14/2014 Time: 1024-1058 OT Time Calculation (min): 34 min Manual 1024-1042 (18') Therapeutic Exercises 1042-1058 (16')  Visit#: 4 of 16  Re-eval: 09/28/14    Authorization: BCBS Medicare  Authorization Time Period: before 10th visit   Authorization Visit#: 4 of 10  Subjective Symptoms/Limitations Symptoms: S: its feeling better, I feel like I can do more with it now." Limitations: PROM for four weeks (09/28/14). Progress to AAROM. Progress as tolerated.  Pain Assessment Currently in Pain?: Yes Pain Score: 2  Pain Location: Shoulder Pain Orientation: Right Pain Type: Acute pain  Precautions/Restrictions     Exercise/Treatments Supine Protraction: PROM;10 reps Horizontal ABduction: PROM;10 reps External Rotation: PROM;10 reps Internal Rotation: PROM;10 reps Flexion: PROM;10 reps ABduction: PROM;10 reps Other Supine Exercises: Bridges 12X Seated Elevation: AROM;12 reps Extension: AROM;12 reps Row: AROM;12 reps Other Seated Exercises: Elbow flexion/extension; wrist flexion/extension, supination/pronation AROM 12x 10X Therapy Ball Flexion: 15 reps ABduction: 15 reps ROM / Strengthening / Isometric Strengthening   Flexion: Supine;3X3" Extension: Supine;3X3" External Rotation: Supine;3X3" Internal Rotation: Supine;3X3" ABduction: Supine;3X3" ADduction: Supine;3X3"  Manual Therapy Manual Therapy: Myofascial release Myofascial Release: Myofascial release and manual stretching to left upper arm, trapezius, and scapularis region to decrease fascial restrictions increase joint mobility in a pain free zone.    Occupational Therapy Assessment and Plan OT Assessment and Plan Clinical Impression Statement: Pt presentng with decreased pain from previous session.  Pt reports improvements in fascial restrictions. Added isometrics 3x3 this  session, and pt tolerated well, with decreased strength.  Added thumb tacks this session - pt tolerated well. OT Plan: increase isometric hold time to 5 seconds   Goals Short Term Goals Short Term Goal 1: Patient will be educated on HEP.  Short Term Goal 1 Progress: Progressing toward goal Short Term Goal 2: Patient will decrease pain to 3/10 during daily tasks.  Short Term Goal 2 Progress: Progressing toward goal Short Term Goal 3: Patient will increase PROM to [facility] to increase ability to reach item at waist level.  Short Term Goal 3 Progress: Progressing toward goal Short Term Goal 4: Patient will decrease fascial restrictions from mod to min amount.  Short Term Goal 4 Progress: Progressing toward goal Short Term Goal 5: Patient will increase strength to 3/5 to increase ability to self-feed with less difficulty.  Short Term Goal 5 Progress: Progressing toward goal Long Term Goals Long Term Goal 1: Patient will return to highest level of functional independence using LUE during daily and lesiure tasks.  Long Term Goal 1 Progress: Progressing toward goal Long Term Goal 2: Patient will decrease pain to 1/10 or less during play activities with grandchildren.  Long Term Goal 2 Progress: Progressing toward goal Long Term Goal 3: Patient will increase AROM to WNL to increase ability to reach into overhead cabinets.  Long Term Goal 3 Progress: Progressing toward goal Long Term Goal 4: Patient will decrease fascial restrictions from min to trace amounts.  Long Term Goal 4 Progress: Progressing toward goal Long Term Goal 5: Patient will increase strength to 4/5 to increase ability to lift heavy items.  Long Term Goal 5 Progress: Progressing toward goal Long Term Goal 6: patient will increase left hand grip strength by 10# to increase ability to hold onto items with less difficulty.  Long Term Goal 6 Progress: Progressing toward goal  Problem List Patient Active Problem List   Diagnosis Date  Noted  .  Pain in joint, shoulder region 08/31/2014  . Decreased range of motion of left shoulder 08/31/2014  . Muscle weakness (generalized) 08/31/2014  . Fracture of proximal end of left humerus 08/13/2014  . SIRS (systemic inflammatory response syndrome) 03/05/2013  . Acute gastroenteritis 03/05/2013  . Sinus tachycardia 03/05/2013  . Acute encephalopathy 03/04/2013  . Fever 03/04/2013  . HBP (high blood pressure) 04/02/2012  . Pulmonary fibrosis assoc with RA 05/24/2011    End of Session Activity Tolerance: Patient tolerated treatment well General Behavior During Therapy: [facility] for tasks assessed/performed  GO    Bea Graff, [place], OTR/L [phone]  09/14/2014, 12:01 PM

## 2014-09-15 ENCOUNTER — Ambulatory Visit (HOSPITAL_COMMUNITY)
Admission: RE | Admit: 2014-09-15 | Discharge: 2014-09-15 | Disposition: A | Discharge status: Home / Self Care | Payer: Medicare Other | Source: Ambulatory Visit | Attending: Orthopedic Surgery | Admitting: Orthopedic Surgery

## 2014-09-15 DIAGNOSIS — IMO0001 Reserved for inherently not codable concepts without codable children: Secondary | ICD-10-CM | POA: Diagnosis not present

## 2014-09-15 NOTE — Progress Notes (Signed)
Occupational Therapy Treatment Patient Details  Name: Michelle Bonilla MRN: [id number] Date of Birth: Sep 25, 1941  Today's Date: 09/15/2014 Time: 4888-9169 OT Time Calculation (min): 26 min MFR: 4503-8882 9' Therex: 8003-4917 17'  Visit#: 5 of 16  Re-eval: 09/28/14    Authorization: BCBS Medicare  Authorization Time Period: before 10th visit   Authorization Visit#: 5 of 10  Subjective Symptoms/Limitations Symptoms: S: It's feeling better, it only hurts when I try to move it up.  Pain Assessment Currently in Pain?: No/denies  Precautions/Restrictions  Precautions Precautions: Shoulder Type of Shoulder Precautions: PROM for 4 weeks (09-28-14). Progress to AAROM. Progress as tolerated  Exercise/Treatments Supine Protraction: PROM;5 reps Horizontal ABduction: PROM;5 reps External Rotation: PROM;5 reps Internal Rotation: PROM;5 reps Flexion: PROM;5 reps ABduction: PROM;5 reps Other Supine Exercises: Bridges 12X Seated Elevation: AROM;12 reps Extension: AROM;12 reps Row: AROM;12 reps Other Seated Exercises: Elbow flexion/extension; wrist flexion/extension, supination/pronation AROM 12x   Therapy Ball Flexion: 15 reps ABduction: 15 reps ROM / Strengthening / Isometric Strengthening Prot/Ret//Elev/Dep: 1' Flexion: Supine;3X5" Extension: Supine;3X5" External Rotation: Supine;3X5" Internal Rotation: Supine;3X5" ABduction: Supine;3X5" ADduction: Supine;3X5"       Manual Therapy Manual Therapy: Myofascial release Myofascial Release: Myofascial release and manual stretching to left upper arm, trapezius, and scapularis region to decrease fascial restrictions increase joint mobility in a pain free zone.  Occupational Therapy Assessment and Plan OT Assessment and Plan Clinical Impression Statement: A: Increased isometric exercises to 3X5".  Added prot/ret/elev/dep exercise.  Patient tolerated well. Patient required cuing to relax arm/shoulder during PROM exercises,  tactile and verbal cuing required for prot/ret/elev/dep exercises.  OT Plan: P: Attempt thumb tacks.    Goals Short Term Goals Short Term Goal 1: Patient will be educated on HEP.  Short Term Goal 2: Patient will decrease pain to 3/10 during daily tasks.  Short Term Goal 3: Patient will increase PROM to [facility] to increase ability to reach item at waist level.  Short Term Goal 4: Patient will decrease fascial restrictions from mod to min amount.  Short Term Goal 5: Patient will increase strength to 3/5 to increase ability to self-feed with less difficulty.  Long Term Goals Long Term Goal 1: Patient will return to highest level of functional independence using LUE during daily and lesiure tasks.  Long Term Goal 2: Patient will decrease pain to 1/10 or less during play activities with grandchildren.  Long Term Goal 3: Patient will increase AROM to WNL to increase ability to reach into overhead cabinets.  Long Term Goal 4: Patient will decrease fascial restrictions from min to trace amounts.  Long Term Goal 5: Patient will increase strength to 4/5 to increase ability to lift heavy items.  Long Term Goal 6: patient will increase left hand grip strength by 10# to increase ability to hold onto items with less difficulty.   Problem List Patient Active Problem List   Diagnosis Date Noted  . Pain in joint, shoulder region 08/31/2014  . Decreased range of motion of left shoulder 08/31/2014  . Muscle weakness (generalized) 08/31/2014  . Fracture of proximal end of left humerus 08/13/2014  . SIRS (systemic inflammatory response syndrome) 03/05/2013  . Acute gastroenteritis 03/05/2013  . Sinus tachycardia 03/05/2013  . Acute encephalopathy 03/04/2013  . Fever 03/04/2013  . HBP (high blood pressure) 04/02/2012  . Pulmonary fibrosis assoc with RA 05/24/2011    End of Session Activity Tolerance: Patient tolerated treatment well General Behavior During Therapy: [facility] for tasks assessed/performed       Ailene Ravel,  OTR/L,CBIS   09/15/2014, 1:57 PM

## 2014-09-21 ENCOUNTER — Other Ambulatory Visit: Payer: Self-pay | Admitting: Internal Medicine

## 2014-09-21 ENCOUNTER — Ambulatory Visit (HOSPITAL_COMMUNITY)
Admission: RE | Admit: 2014-09-21 | Discharge: 2014-09-21 | Disposition: A | Discharge status: Home / Self Care | Payer: Medicare Other | Source: Ambulatory Visit | Attending: Internal Medicine | Admitting: Internal Medicine

## 2014-09-21 DIAGNOSIS — IMO0001 Reserved for inherently not codable concepts without codable children: Secondary | ICD-10-CM | POA: Diagnosis not present

## 2014-09-21 NOTE — Progress Notes (Signed)
Occupational Therapy Treatment Patient Details  Name: Michelle Bonilla MRN: [id number] Date of Birth: 26-Jul-1941  Today's Date: 09/21/2014 Time: 1023-1058 OT Time Calculation (min): 35 min Manual 1023-1035 (12') Therapeutic Exercises 1035-1058 (28')  Visit#: 6 of 72  Re-eval: 09/28/14    Authorization: BCBS Medicare  Authorization Time Period: before 10th visit   Authorization Visit#: 6 of 10  Subjective Symptoms/Limitations Symptoms: "Not until I move my arm up that way!" Pain Assessment Currently in Pain?: No/denies  Precautions/Restrictions     Exercise/Treatments Supine Protraction: PROM;10 reps Horizontal ABduction: PROM;10 reps External Rotation: PROM;10 reps Internal Rotation: PROM;10 reps Flexion: PROM;10 reps ABduction: PROM;10 reps Other Supine Exercises: Bridges 15x Seated Elevation: AROM;15 reps Extension: AROM;15 reps Row: AROM;15 reps Other Seated Exercises: Elbow flexion/extension; wrist flexion/extension, supination/pronation AROM 15x Therapy Ball Flexion: 20 reps ABduction: 20 reps ROM / Strengthening / Isometric Strengthening Thumb Tacks: 1 min Prot/Ret//Elev/Dep: 1' Flexion: Supine;3X5" Extension: Supine;3X5" External Rotation: Supine;3X5" Internal Rotation: Supine;3X5" ABduction: Supine;3X5" ADduction: Supine;3X5"  Manual Therapy Manual Therapy: Myofascial release Myofascial Release: Myofascial release and manual stretching to left upper arm, trapezius, and scapularis region to decrease fascial restrictions increase joint mobility in a pain free zone.    Occupational Therapy Assessment and Plan OT Assessment and Plan Clinical Impression Statement: Pt verbalizing improved engagemeing in IADL activites - was able to braid her granddaughter's hair and her writing has improved.  ncouraged pt to refarin from significant AROM moveemtns at this time.  Pt continues to required max cueing to relax arm during PROM. Increased ball stretch reps, with  cues to push stretch (not just complete reps quickly).  Pt required significant cueing with retraction for pro/ret/elev/dep.  Added thumbtacks and pt tolerated well. OT Plan: P: increase isometrics to 5x5   Goals Short Term Goals Short Term Goal 1: Patient will be educated on HEP.  Short Term Goal 1 Progress: Progressing toward goal Short Term Goal 2: Patient will decrease pain to 3/10 during daily tasks.  Short Term Goal 2 Progress: Progressing toward goal Short Term Goal 3: Patient will increase PROM to [facility] to increase ability to reach item at waist level.  Short Term Goal 3 Progress: Progressing toward goal Short Term Goal 4: Patient will decrease fascial restrictions from mod to min amount.  Short Term Goal 4 Progress: Progressing toward goal Short Term Goal 5: Patient will increase strength to 3/5 to increase ability to self-feed with less difficulty.  Short Term Goal 5 Progress: Progressing toward goal Long Term Goals Long Term Goal 1: Patient will return to highest level of functional independence using LUE during daily and lesiure tasks.  Long Term Goal 1 Progress: Progressing toward goal Long Term Goal 2: Patient will decrease pain to 1/10 or less during play activities with grandchildren.  Long Term Goal 2 Progress: Progressing toward goal Long Term Goal 3: Patient will increase AROM to WNL to increase ability to reach into overhead cabinets.  Long Term Goal 3 Progress: Progressing toward goal Long Term Goal 4: Patient will decrease fascial restrictions from min to trace amounts.  Long Term Goal 4 Progress: Progressing toward goal Long Term Goal 5: Patient will increase strength to 4/5 to increase ability to lift heavy items.  Long Term Goal 5 Progress: Progressing toward goal Long Term Goal 6: patient will increase left hand grip strength by 10# to increase ability to hold onto items with less difficulty.  Long Term Goal 6 Progress: Progressing toward goal  Problem  List Patient Active Problem List  Diagnosis Date Noted  . Pain in joint, shoulder region 08/31/2014  . Decreased range of motion of left shoulder 08/31/2014  . Muscle weakness (generalized) 08/31/2014  . Fracture of proximal end of left humerus 08/13/2014  . SIRS (systemic inflammatory response syndrome) 03/05/2013  . Acute gastroenteritis 03/05/2013  . Sinus tachycardia 03/05/2013  . Acute encephalopathy 03/04/2013  . Fever 03/04/2013  . HBP (high blood pressure) 04/02/2012  . Pulmonary fibrosis assoc with RA 05/24/2011    End of Session Activity Tolerance: Patient tolerated treatment well General Behavior During Therapy: [facility] for tasks assessed/performed  GO    Bea Graff, MS, OTR/L [phone]  09/21/2014, 10:59 AM

## 2014-09-22 ENCOUNTER — Ambulatory Visit (HOSPITAL_COMMUNITY)
Admission: RE | Admit: 2014-09-22 | Discharge: 2014-09-22 | Disposition: A | Discharge status: Home / Self Care | Payer: Medicare Other | Source: Ambulatory Visit | Attending: Internal Medicine | Admitting: Internal Medicine

## 2014-09-22 DIAGNOSIS — IMO0001 Reserved for inherently not codable concepts without codable children: Secondary | ICD-10-CM | POA: Diagnosis not present

## 2014-09-22 NOTE — Progress Notes (Signed)
Occupational Therapy Treatment Patient Details  Name: Michelle Bonilla MRN: [id number] Date of Birth: May 18, 1941  Today's Date: 09/22/2014 Time: 6808-8110 OT Time Calculation (min): 38 min Manual therapy 315-945 20'  therapeutic exercises 912-930 18'  Visit#: 7 of 16  Re-eval: 09/28/14    Authorization: BCBS Medicare  Authorization Time Period: before 10th visit   Authorization Visit#: 7 of 10  Subjective  S:  Its a little sore where I have been moving it. Limitations: PROM for four weeks (09/28/14). Progress to AAROM. Progress as tolerated.  Pain Assessment Pain Score: 2  Pain Location: Shoulder Pain Orientation: Right Pain Type: Acute pain  Precautions/Restrictions   progress as tolerated  Exercise/Treatments Supine Protraction: PROM;10 reps Horizontal ABduction: PROM;10 reps External Rotation: PROM;10 reps Internal Rotation: PROM;10 reps Flexion: PROM;10 reps Other Supine Exercises: elbow flexion, extension, pronation, supination, wrist flexion extension 15 times AROM Seated Elevation: AROM;15 reps Extension: AROM;15 reps Row: AROM;15 reps Therapy Ball Flexion: 20 reps ABduction: 20 reps ROM / Strengthening / Isometric Strengthening Thumb Tacks: 1 min Prot/Ret//Elev/Dep: 1 min  Flexion: Supine;3X5" Extension: Supine;3X5" External Rotation: Supine;3X5" Internal Rotation: Supine;3X5" ABduction: Supine;3X5" ADduction: Supine;3X5"    Manual Therapy Manual Therapy: Myofascial release Myofascial Release: myofascial release and manual stretching to left upper arm, trapezius, and scapularis region to decrease fascial restrictions increase joint mobility in a pain free zone  Occupational Therapy Assessment and Plan OT Assessment and Plan Clinical Impression Statement: A:  Patient has difficulty depressing scapula during PROM exercises.  Required max tactile cues with prot/ret//elev/dep exercises.  OT Plan: P:  Increase isometrics to 5 x 5"  decrease amount of  facilitation required iwth prot/ret//elev/dep.    Goals Short Term Goals Short Term Goal 1: Patient will be educated on HEP.  Short Term Goal 2: Patient will decrease pain to 3/10 during daily tasks.  Short Term Goal 3: Patient will increase PROM to [facility] to increase ability to reach item at waist level.  Short Term Goal 4: Patient will decrease fascial restrictions from mod to min amount.  Short Term Goal 5: Patient will increase strength to 3/5 to increase ability to self-feed with less difficulty.  Long Term Goals Long Term Goal 1: Patient will return to highest level of functional independence using LUE during daily and lesiure tasks.  Long Term Goal 2: Patient will decrease pain to 1/10 or less during play activities with grandchildren.  Long Term Goal 3: Patient will increase AROM to WNL to increase ability to reach into overhead cabinets.  Long Term Goal 4: Patient will decrease fascial restrictions from min to trace amounts.  Long Term Goal 5: Patient will increase strength to 4/5 to increase ability to lift heavy items.  Long Term Goal 6: patient will increase left hand grip strength by 10# to increase ability to hold onto items with less difficulty.   Problem List Patient Active Problem List   Diagnosis Date Noted  . Pain in joint, shoulder region 08/31/2014  . Decreased range of motion of left shoulder 08/31/2014  . Muscle weakness (generalized) 08/31/2014  . Fracture of proximal end of left humerus 08/13/2014  . SIRS (systemic inflammatory response syndrome) 03/05/2013  . Acute gastroenteritis 03/05/2013  . Sinus tachycardia 03/05/2013  . Acute encephalopathy 03/04/2013  . Fever 03/04/2013  . HBP (high blood pressure) 04/02/2012  . Pulmonary fibrosis assoc with RA 05/24/2011    End of Session Activity Tolerance: Patient tolerated treatment well General Behavior During Therapy: [facility] for tasks assessed/performed  GO  Vangie Bicker,  OTR/L [phone]  09/22/2014, 9:32 AM

## 2014-09-28 ENCOUNTER — Ambulatory Visit (HOSPITAL_COMMUNITY)
Admission: RE | Admit: 2014-09-28 | Discharge: 2014-09-28 | Disposition: A | Discharge status: Home / Self Care | Payer: Medicare Other | Source: Ambulatory Visit | Attending: Internal Medicine | Admitting: Internal Medicine

## 2014-09-28 DIAGNOSIS — IMO0001 Reserved for inherently not codable concepts without codable children: Secondary | ICD-10-CM | POA: Diagnosis not present

## 2014-09-28 NOTE — Evaluation (Signed)
Occupational Therapy Reassessment  Patient Details  Name: TARNESHA ULLOA MRN: [id number] Date of Birth: 08/11/1941  Today's Date: 09/28/2014 Time: 8307-4600 OT Time Calculation (min): 39 min MFR: 2984-7308 10' Reassess: 5694-3700 16' Theract: 5259-1028 13'  Visit#: 8 of 16  Re-eval: 10/26/14  Assessment Diagnosis: left proximal humerus fracture  Authorization: [employer] Medicare  Authorization Time Period: before 22nd visit   Authorization Visit#: 8 of 22   Past Medical History:  Past Medical History  Diagnosis Date  . Hypertension   . Hypothyroid 03/06/2011    per patient  . PUD (peptic ulcer disease)     Dr Renae Fickle  . Connective tissue disease   . Inflammatory arthritis    Past Surgical History:  Past Surgical History  Procedure Laterality Date  . Colonoscopy    . Esophagogstroduodenoscopy    . Colonoscopy N/A 10/07/2013    Procedure: COLONOSCOPY;  Surgeon: Malissa Hippo, MD;  [facility]: [facility];  Service: Endoscopy;  Laterality: N/A;  1200    Subjective Symptoms/Limitations Symptoms: S: My arm is feeling a lot better, I've been writing with it a lot more.  Pain Assessment Currently in Pain?: No/denies  Precautions/Restrictions  Precautions Precautions: Shoulder Type of Shoulder Precautions: PROM for 4 weeks (09-28-14). Progress to AAROM. Progress as tolerated    Assessment Additional Assessments LUE Assessment LUE Assessment:  (Assessed supine. IR/ER adducted) LUE AROM (degrees) Left Shoulder Flexion: 133 Degrees Left Shoulder ABduction: 115 Degrees Left Shoulder Internal Rotation: 90 Degrees Left Shoulder External Rotation: 44 Degrees LUE PROM (degrees) Left Shoulder Flexion: 155 Degrees (on eval: 99 ) Left Shoulder ABduction: 137 Degrees (on eval: 65) Left Shoulder Internal Rotation: 90 Degrees (on eval: 83) Left Shoulder External Rotation: 59 Degrees (on eval: 16) LUE Strength Grip (lbs): 21 (on eval: 35) Lateral Pinch: 17 lbs (on eval: 13) 3  Point Pinch: 9 lbs (on eval; 10) Palpation Palpation: Min fascial restrictions in left upper arm, trapezius, and scapularis region.   Exercise/Treatments  09/28/14 1000  Hand Exercises  Theraputty - Flatten red  Theraputty - Roll red  Theraputty - Grip red  Theraputty - Pinch red      Manual Therapy Manual Therapy: Myofascial release Myofascial Release: myofascial release and manual stretching to left upper arm, trapezius, and scapularis region to decrease fascial restrictions increase joint mobility in a pain free zone  Occupational Therapy Assessment and Plan OT Assessment and Plan Clinical Impression Statement: A: Reassessment completed this date. Patient has met 4/5 short-term goals.  LUE grip strength decreased, three point pinch and lateral pinch strength both increased. Patient provided red theraputty and HEP. Patient reports she is using her left arm more and is beginning to write with.  Patient is able to use left arm to complete waist level activities, however has to use right arm to assist with activities above waist level.  OT Plan: P: Begin AAROM exercises.    Goals Short Term Goals Time to Complete Short Term Goals: 4 weeks Short Term Goal 1: Patient will be educated on HEP.  Short Term Goal 1 Progress: Met Short Term Goal 2: Patient will decrease pain to 3/10 during daily tasks.  Short Term Goal 2 Progress: Met Short Term Goal 3: Patient will increase PROM to [facility] to increase ability to reach item at waist level.  Short Term Goal 3 Progress: Met Short Term Goal 4: Patient will decrease fascial restrictions from mod to min amount.  Short Term Goal 4 Progress: Met Short Term Goal 5: Patient will  increase strength to 3/5 to increase ability to self-feed with less difficulty.  Short Term Goal 5 Progress: Progressing toward goal Long Term Goals Time to Complete Long Term Goals: 8 weeks Long Term Goal 1: Patient will return to highest level of functional independence  using LUE during daily and lesiure tasks.  Long Term Goal 1 Progress: Progressing toward goal Long Term Goal 2: Patient will decrease pain to 1/10 or less during play activities with grandchildren.  Long Term Goal 2 Progress: Progressing toward goal Long Term Goal 3: Patient will increase AROM to WNL to increase ability to reach into overhead cabinets.  Long Term Goal 3 Progress: Progressing toward goal Long Term Goal 4: Patient will decrease fascial restrictions from min to trace amounts.  Long Term Goal 4 Progress: Progressing toward goal Long Term Goal 5: Patient will increase strength to 4/5 to increase ability to lift heavy items.  Long Term Goal 5 Progress: Progressing toward goal Long Term Goal 6: patient will increase left hand grip strength by 10# to increase ability to hold onto items with less difficulty.  Long Term Goal 6 Progress: Progressing toward goal  Problem List Patient Active Problem List   Diagnosis Date Noted  . Pain in joint, shoulder region 08/31/2014  . Decreased range of motion of left shoulder 08/31/2014  . Muscle weakness (generalized) 08/31/2014  . Fracture of proximal end of left humerus 08/13/2014  . SIRS (systemic inflammatory response syndrome) 03/05/2013  . Acute gastroenteritis 03/05/2013  . Sinus tachycardia 03/05/2013  . Acute encephalopathy 03/04/2013  . Fever 03/04/2013  . HBP (high blood pressure) 04/02/2012  . Pulmonary fibrosis assoc with RA 05/24/2011    End of Session Activity Tolerance: Patient tolerated treatment well General Behavior During Therapy: WFL for tasks assessed/performed OT Plan of Care OT Home Exercise Plan: Theraputty HEP (red) OT Patient Instructions: handout (scanned) Consulted and Agree with Plan of Care: Patient  Functional Assessment Tool Used: FOTO score: 56/100 (44% impaired) Functional Limitation: Carrying, moving and handling objects Carrying, Moving and Handling Objects Current Status ([id number]): At least 40  percent but less than 60 percent impaired, limited or restricted Carrying, Moving and Handling Objects Goal Status [phone]): At least 1 percent but less than 20 percent impaired, limited or restricted  Ailene Ravel, OTR/L,CBIS   09/28/2014, 11:23 AM  Physician Documentation Your signature is required to indicate approval of the treatment plan as stated above.  Please sign and either send electronically or make a copy of this report for your files and return this physician signed original.  Please mark one 1.__approve of plan  2. ___approve of plan with the following conditions.   ______________________________                                                          _____________________ Physician Signature  Date  

## 2014-09-29 ENCOUNTER — Ambulatory Visit (HOSPITAL_COMMUNITY)
Admission: RE | Admit: 2014-09-29 | Discharge: 2014-09-29 | Disposition: A | Discharge status: Home / Self Care | Payer: Medicare Other | Source: Ambulatory Visit | Attending: Internal Medicine | Admitting: Internal Medicine

## 2014-09-29 DIAGNOSIS — IMO0001 Reserved for inherently not codable concepts without codable children: Secondary | ICD-10-CM | POA: Diagnosis not present

## 2014-09-29 NOTE — Evaluation (Signed)
sbnr

## 2014-09-29 NOTE — Progress Notes (Signed)
Occupational Therapy Treatment Patient Details  Name: Michelle Bonilla MRN: [id number] Date of Birth: 06/02/1941  Today's Date: 09/29/2014 Time: 4270-6237 OT Time Calculation (min): 37 min MFR: 6283-1517 61' Therex: 6073-7106 26'  Visit#: 9 of 16  Re-eval: 10/26/14    Authorization: [employer] Medicare  Authorization Time Period: before 22nd visit   Authorization Visit#: 9 of 22  Subjective Symptoms/Limitations Symptoms: S: I've been folding clothes this morning, I'm trying to use my arm more.  Pain Assessment Currently in Pain?: No/denies  Precautions/Restrictions  Precautions Precautions: Shoulder Type of Shoulder Precautions: PROM for 4 weeks (09-28-14). Progress to AAROM. Progress as tolerated  Exercise/Treatments Supine Protraction: PROM;5 reps;AAROM;10 reps Horizontal ABduction: PROM;5 reps;AAROM;10 reps External Rotation: PROM;5 reps;AAROM;10 reps Internal Rotation: PROM;5 reps;AAROM;10 reps Flexion: PROM;5 reps;AAROM;10 reps ABduction: PROM;5 reps;AAROM;10 reps   Standing Protraction: AAROM;10 reps Horizontal ABduction: AAROM;10 reps External Rotation: AAROM;10 reps Internal Rotation: AAROM;10 reps Flexion: AAROM;10 reps ABduction: AAROM;10 reps Pulleys Flexion: 1 minute ABduction: 1 minute   ROM / Strengthening / Isometric Strengthening Wall Wash: 1' Thumb Tacks: 1 min Prot/Ret//Elev/Dep: 1 min  (Max facilitation required for correct posture and completion)      Manual Therapy Manual Therapy: Myofascial release Myofascial Release: myofascial release and manual stretching to left upper arm, trapezius, and scapularis region to decrease fascial restrictions increase joint mobility in a pain free zone  Occupational Therapy Assessment and Plan OT Assessment and Plan Clinical Impression Statement: A: Added AAROM exercises, pulley exercises, and wall wash. Patient required max facilitation and cuing to correctly complete prot/ret/elev/dep exercises. Patient  tolerated treatment well. Patient reports she is trying to use her left arm more during her daily activities.  OT Plan: P: Increase independence with  AAROM exercises. Update HEP for AAROM.    Goals Short Term Goals Time to Complete Short Term Goals: 4 weeks Short Term Goal 1: Patient will be educated on HEP.  Short Term Goal 2: Patient will decrease pain to 3/10 during daily tasks.  Short Term Goal 3: Patient will increase PROM to [facility] to increase ability to reach item at waist level.  Short Term Goal 4: Patient will decrease fascial restrictions from mod to min amount.  Short Term Goal 5: Patient will increase strength to 3/5 to increase ability to self-feed with less difficulty.  Long Term Goals Time to Complete Long Term Goals: 8 weeks Long Term Goal 1: Patient will return to highest level of functional independence using LUE during daily and lesiure tasks.  Long Term Goal 2: Patient will decrease pain to 1/10 or less during play activities with grandchildren.  Long Term Goal 3: Patient will increase AROM to WNL to increase ability to reach into overhead cabinets.  Long Term Goal 4: Patient will decrease fascial restrictions from min to trace amounts.  Long Term Goal 5: Patient will increase strength to 4/5 to increase ability to lift heavy items.  Long Term Goal 6: patient will increase left hand grip strength by 10# to increase ability to hold onto items with less difficulty.   Problem List Patient Active Problem List   Diagnosis Date Noted  . Pain in joint, shoulder region 08/31/2014  . Decreased range of motion of left shoulder 08/31/2014  . Muscle weakness (generalized) 08/31/2014  . Fracture of proximal end of left humerus 08/13/2014  . SIRS (systemic inflammatory response syndrome) 03/05/2013  . Acute gastroenteritis 03/05/2013  . Sinus tachycardia 03/05/2013  . Acute encephalopathy 03/04/2013  . Fever 03/04/2013  . HBP (high blood pressure)  04/02/2012  . Pulmonary fibrosis  assoc with RA 05/24/2011    End of Session Activity Tolerance: Patient tolerated treatment well General Behavior During Therapy: [facility] for tasks assessed/performed      Ailene Ravel, OTR/L,CBIS   09/29/2014, 11:30 AM

## 2014-10-05 ENCOUNTER — Ambulatory Visit (HOSPITAL_COMMUNITY)
Admission: RE | Admit: 2014-10-05 | Discharge: 2014-10-05 | Disposition: A | Discharge status: Home / Self Care | Payer: Medicare Other | Source: Ambulatory Visit | Attending: Internal Medicine | Admitting: Internal Medicine

## 2014-10-05 DIAGNOSIS — Z5189 Encounter for other specified aftercare: Secondary | ICD-10-CM | POA: Insufficient documentation

## 2014-10-05 DIAGNOSIS — I10 Essential (primary) hypertension: Secondary | ICD-10-CM | POA: Diagnosis not present

## 2014-10-05 DIAGNOSIS — M25512 Pain in left shoulder: Secondary | ICD-10-CM | POA: Diagnosis not present

## 2014-10-05 DIAGNOSIS — M6281 Muscle weakness (generalized): Secondary | ICD-10-CM | POA: Diagnosis not present

## 2014-10-05 NOTE — Progress Notes (Signed)
Occupational Therapy Treatment Patient Details  Name: Michelle Bonilla MRN: [id number] Date of Birth: [birth year]  Today's Date: 10/05/2014 Time: 9326-7124 OT Time Calculation (min): 41 min MFR: 5809-9833 8' Therex: 8250-5397 33'  Visit#: 21 of 16  Re-eval: 10/26/14    Authorization: BC[employer] Medicare  Authorization Time Period: before 22nd visit   Authorization Visit#: 10 of 22  Subjective Symptoms/Limitations Symptoms: S: I can lift my arm almost all the way up now.  Pain Assessment Currently in Pain?: No/denies  Precautions/Restrictions  Precautions Precautions: Shoulder Type of Shoulder Precautions: PROM for 4 weeks (09-28-14). Progress to AAROM. Progress as tolerated  Exercise/Treatments Supine Protraction: PROM;5 reps;AAROM;15 reps Horizontal ABduction: PROM;5 reps;AAROM;15 reps External Rotation: PROM;5 reps;AAROM;15 reps Internal Rotation: PROM;5 reps;AAROM;15 reps Flexion: PROM;5 reps;AAROM;15 reps ABduction: PROM;5 reps;AAROM;15 reps   Standing Protraction: AAROM;15 reps Horizontal ABduction: AAROM;15 reps External Rotation: AAROM;15 reps Internal Rotation: AAROM;15 reps Flexion: AAROM;15 reps ABduction: AAROM;15 reps Pulleys Flexion: 1 minute ABduction: 1 minute   ROM / Strengthening / Isometric Strengthening Wall Wash: 1\' 30"  Proximal Shoulder Strengthening, Supine: 12X each Proximal Shoulder Strengthening, Seated: 12X each        Manual Therapy Manual Therapy: Myofascial release Myofascial Release: myofascial release and manual stretching to left upper arm, trapezius, and scapularis region to decrease fascial restrictions increase joint mobility in a pain free zone  Occupational Therapy Assessment and Plan OT Assessment and Plan Clinical Impression Statement: A: Increased AAROM repetitions to 15 in supine and standing. Added proximal strengthening exercises in supine and standing. Increased wall wash to 1 minute 30 seconds.  Patient tolerated  treatment well.  Updated HEP for AAROM exercises.  OT Plan: P: Take measurements for MD appointment. Attempt AROM exercises in supine and standing.    Goals Short Term Goals Time to Complete Short Term Goals: 4 weeks Short Term Goal 1: Patient will be educated on HEP.  Short Term Goal 2: Patient will decrease pain to 3/10 during daily tasks.  Short Term Goal 3: Patient will increase PROM to [facility] to increase ability to reach item at waist level.  Short Term Goal 4: Patient will decrease fascial restrictions from mod to min amount.  Short Term Goal 5: Patient will increase strength to 3/5 to increase ability to self-feed with less difficulty.  Short Term Goal 5 Progress: Progressing toward goal Long Term Goals Time to Complete Long Term Goals: 8 weeks Long Term Goal 1: Patient will return to highest level of functional independence using LUE during daily and lesiure tasks.  Long Term Goal 1 Progress: Progressing toward goal Long Term Goal 2: Patient will decrease pain to 1/10 or less during play activities with grandchildren.  Long Term Goal 2 Progress: Progressing toward goal Long Term Goal 3: Patient will increase AROM to WNL to increase ability to reach into overhead cabinets.  Long Term Goal 3 Progress: Progressing toward goal Long Term Goal 4: Patient will decrease fascial restrictions from min to trace amounts.  Long Term Goal 4 Progress: Progressing toward goal Long Term Goal 5: Patient will increase strength to 4/5 to increase ability to lift heavy items.  Long Term Goal 5 Progress: Progressing toward goal Long Term Goal 6: patient will increase left hand grip strength by 10# to increase ability to hold onto items with less difficulty.  Long Term Goal 6 Progress: Progressing toward goal  Problem List Patient Active Problem List   Diagnosis Date Noted  . Pain in joint, shoulder region 08/31/2014  . Decreased range of motion  of left shoulder 08/31/2014  . Muscle weakness  (generalized) 08/31/2014  . Fracture of proximal end of left humerus 08/13/2014  . SIRS (systemic inflammatory response syndrome) 03/05/2013  . Acute gastroenteritis 03/05/2013  . Sinus tachycardia 03/05/2013  . Acute encephalopathy 03/04/2013  . Fever 03/04/2013  . HBP (high blood pressure) 04/02/2012  . Pulmonary fibrosis assoc with RA 05/24/2011    End of Session Activity Tolerance: Patient tolerated treatment well General Behavior During Therapy: [facility] for tasks assessed/performed OT Plan of Care OT Home Exercise Plan: AAROM exercises OT Patient Instructions: handout (scanned) Consulted and Agree with Plan of Care: Patient   Ailene Ravel, OTR/L,CBIS   10/05/2014, 9:33 AM

## 2014-10-07 ENCOUNTER — Ambulatory Visit (HOSPITAL_COMMUNITY)
Admission: RE | Admit: 2014-10-07 | Discharge: 2014-10-07 | Disposition: A | Discharge status: Home / Self Care | Payer: Medicare Other | Source: Ambulatory Visit | Attending: Internal Medicine | Admitting: Internal Medicine

## 2014-10-07 ENCOUNTER — Ambulatory Visit (INDEPENDENT_AMBULATORY_CARE_PROVIDER_SITE_OTHER): Payer: Self-pay | Admitting: Orthopedic Surgery

## 2014-10-07 VITALS — BP 142/85 | Ht 62.0 in | Wt 114.4 lb

## 2014-10-07 DIAGNOSIS — Z5189 Encounter for other specified aftercare: Secondary | ICD-10-CM | POA: Diagnosis not present

## 2014-10-07 DIAGNOSIS — S42202D Unspecified fracture of upper end of left humerus, subsequent encounter for fracture with routine healing: Secondary | ICD-10-CM

## 2014-10-07 NOTE — Evaluation (Signed)
Occupational Therapy Progress Note  Patient Details  Name: Michelle Bonilla MRN: [id number] Date of Birth: [birth year]  Today's Date: 10/07/2014 Time: 7616-0737 OT Time Calculation (min): 37 min MFR: 1062-6948 10' Reassess: 5462-7035 12' Therex: 0093-8182 15  Visit#: 10 of 16  Re-eval: 10/26/14     Authorization: BCBS Medicare  Authorization Time Period: before 22nd visit   Authorization Visit#: 46 of 17   Past Medical History:  Past Medical History  Diagnosis Date  . Hypertension   . Hypothyroid 03/06/2011    per patient  . PUD (peptic ulcer disease)     Dr Dereck Leep  . Connective tissue disease   . Inflammatory arthritis    Past Surgical History:  Past Surgical History  Procedure Laterality Date  . Colonoscopy    . Esophagogstroduodenoscopy    . Colonoscopy N/A 10/07/2013    Procedure: COLONOSCOPY;  Surgeon: Rogene Houston, MD;  Location: [facility];  Service: Endoscopy;  Laterality: N/A;  1200    Subjective Symptoms/Limitations Symptoms: S: It's feeling better, I've been using it more.  Pain Assessment Currently in Pain?: No/denies  Precautions/Restrictions  Precautions Precautions: Shoulder Type of Shoulder Precautions: PROM for 4 weeks (09-28-14). Progress to AAROM. Progress as tolerated  Assessment   Additional Assessments LUE Assessment LUE Assessment:  (assessed supine. IR/ER adducted) LUE AROM (degrees) Left Shoulder Flexion: 150 Degrees (on progress note: 133) Left Shoulder ABduction: 120 Degrees (on progress notel: 115) Left Shoulder Internal Rotation: 90 Degrees (on progress note: 90) Left Shoulder External Rotation: 70 Degrees (on progress note: 44) LUE PROM (degrees) Left Shoulder Flexion: 159 Degrees (on progress note: 155) Left Shoulder ABduction: 134 Degrees (on progress note: 137) Left Shoulder Internal Rotation: 90 Degrees (on progress note: 90) Left Shoulder External Rotation: 79 Degrees (on progress note: 59) LUE Strength LUE Overall  Strength Comments:  (not assessed on eval) Left Shoulder Flexion: 3/5 Left Shoulder ABduction: 3/5 Left Shoulder Internal Rotation: 3/5 Left Shoulder External Rotation: 3/5     Exercise/Treatments Supine Protraction: PROM;5 reps;AAROM;15 reps Horizontal ABduction: PROM;5 reps;AAROM;15 reps External Rotation: PROM;5 reps;AAROM;15 reps Internal Rotation: PROM;5 reps;AAROM;15 reps Flexion: PROM;5 reps;AAROM;15 reps ABduction: PROM;5 reps;AAROM;15 reps   Standing Protraction: AAROM;15 reps Horizontal ABduction: AAROM;15 reps External Rotation: AAROM;15 reps Internal Rotation: AAROM;15 reps Flexion: AAROM;15 reps ABduction: AAROM;15 reps   ROM / Strengthening / Isometric Strengthening Proximal Shoulder Strengthening, Supine: 15X no rest breaks        Manual Therapy Manual Therapy: Myofascial release Myofascial Release: myofascial release and manual stretching to left upper arm, trapezius, and scapularis region to decrease fascial restrictions increase joint mobility in a pain free zone  Occupational Therapy Assessment and Plan OT Assessment and Plan Clinical Impression Statement: A: Reassessed PROM/AROM for MD appointment. Continued AAROM exercises. Patient tolerated well. Patient required cuing to relax arm during manual stretching and facilitation during exercises for correct posture.  OT Plan: P: Complete exercises in front of a mirror. Focus on increasing PROM and maintaining correct posture/alignment.   Goals Short Term Goals Time to Complete Short Term Goals: 4 weeks Short Term Goal 1: Patient will be educated on HEP.  Short Term Goal 2: Patient will decrease pain to 3/10 during daily tasks.  Short Term Goal 3: Patient will increase PROM to [facility] to increase ability to reach item at waist level.  Short Term Goal 4: Patient will decrease fascial restrictions from mod to min amount.  Short Term Goal 5: Patient will increase strength to 3/5 to increase ability to self-feed  with less difficulty.  Long Term Goals Time to Complete Long Term Goals: 8 weeks Long Term Goal 1: Patient will return to highest level of functional independence using LUE during daily and lesiure tasks.  Long Term Goal 2: Patient will decrease pain to 1/10 or less during play activities with grandchildren.  Long Term Goal 3: Patient will increase AROM to WNL to increase ability to reach into overhead cabinets.  Long Term Goal 4: Patient will decrease fascial restrictions from min to trace amounts.  Long Term Goal 5: Patient will increase strength to 4/5 to increase ability to lift heavy items.  Long Term Goal 6: patient will increase left hand grip strength by 10# to increase ability to hold onto items with less difficulty.   Problem List Patient Active Problem List   Diagnosis Date Noted  . Pain in joint, shoulder region 08/31/2014  . Decreased range of motion of left shoulder 08/31/2014  . Muscle weakness (generalized) 08/31/2014  . Fracture of proximal end of left humerus 08/13/2014  . SIRS (systemic inflammatory response syndrome) 03/05/2013  . Acute gastroenteritis 03/05/2013  . Sinus tachycardia 03/05/2013  . Acute encephalopathy 03/04/2013  . Fever 03/04/2013  . HBP (high blood pressure) 04/02/2012  . Pulmonary fibrosis assoc with RA 05/24/2011    End of Session Activity Tolerance: Patient tolerated treatment well General Behavior During Therapy: [facility] for tasks assessed/performed   Ailene Ravel, OTR/L,CBIS   10/07/2014, 9:49 AM  Physician Documentation Your signature is required to indicate approval of the treatment plan as stated above.  Please sign and either send electronically or make a copy of this report for your files and return this physician signed original.  Please mark one 1.__approve of plan  2. ___approve of plan with the following conditions.   ______________________________                                                           _____________________ Physician Signature                                                                                                             Date

## 2014-10-08 ENCOUNTER — Encounter: Payer: Self-pay | Admitting: Orthopedic Surgery

## 2014-10-08 NOTE — Progress Notes (Signed)
Chief Complaint  Patient presents with  . Follow-up    6 week recheck Lt shoulder s/p therapy, DOI 08/12/14   Left proximal humerus fracture a proximally 7 weeks post injury  X-rays previously show fracture healing  Patient in therapy   The patient has no pain in her left shoulder except when she gets in extremes of flexion she can lift her arm 150 she has no tenderness in the proximal humerus  She's doing very well. She is able to do most functional activities including housework  She is released from care.

## 2014-10-12 ENCOUNTER — Ambulatory Visit (HOSPITAL_COMMUNITY): Payer: Medicare Other

## 2014-10-14 ENCOUNTER — Ambulatory Visit (HOSPITAL_COMMUNITY): Payer: Medicare Other

## 2014-10-19 ENCOUNTER — Ambulatory Visit (HOSPITAL_COMMUNITY): Payer: Medicare Other

## 2014-10-21 ENCOUNTER — Ambulatory Visit (HOSPITAL_COMMUNITY): Payer: Medicare Other

## 2014-10-26 ENCOUNTER — Ambulatory Visit (HOSPITAL_COMMUNITY): Payer: Medicare Other

## 2014-10-28 ENCOUNTER — Ambulatory Visit (HOSPITAL_COMMUNITY): Payer: Medicare Other

## 2014-12-25 ENCOUNTER — Other Ambulatory Visit (INDEPENDENT_AMBULATORY_CARE_PROVIDER_SITE_OTHER): Payer: Self-pay | Admitting: Internal Medicine

## 2015-01-13 IMAGING — CR DG CHEST 2V
2 series · 2 of 2 positions shown · non-contrast
Comparison: March 04, 2013 chest radiograph and chest CT March 04, 2013

CLINICAL DATA: Pulmonary fibrosis ; hypertension

EXAM:
CHEST  2 VIEW

[view not recorded (1 of 2)]
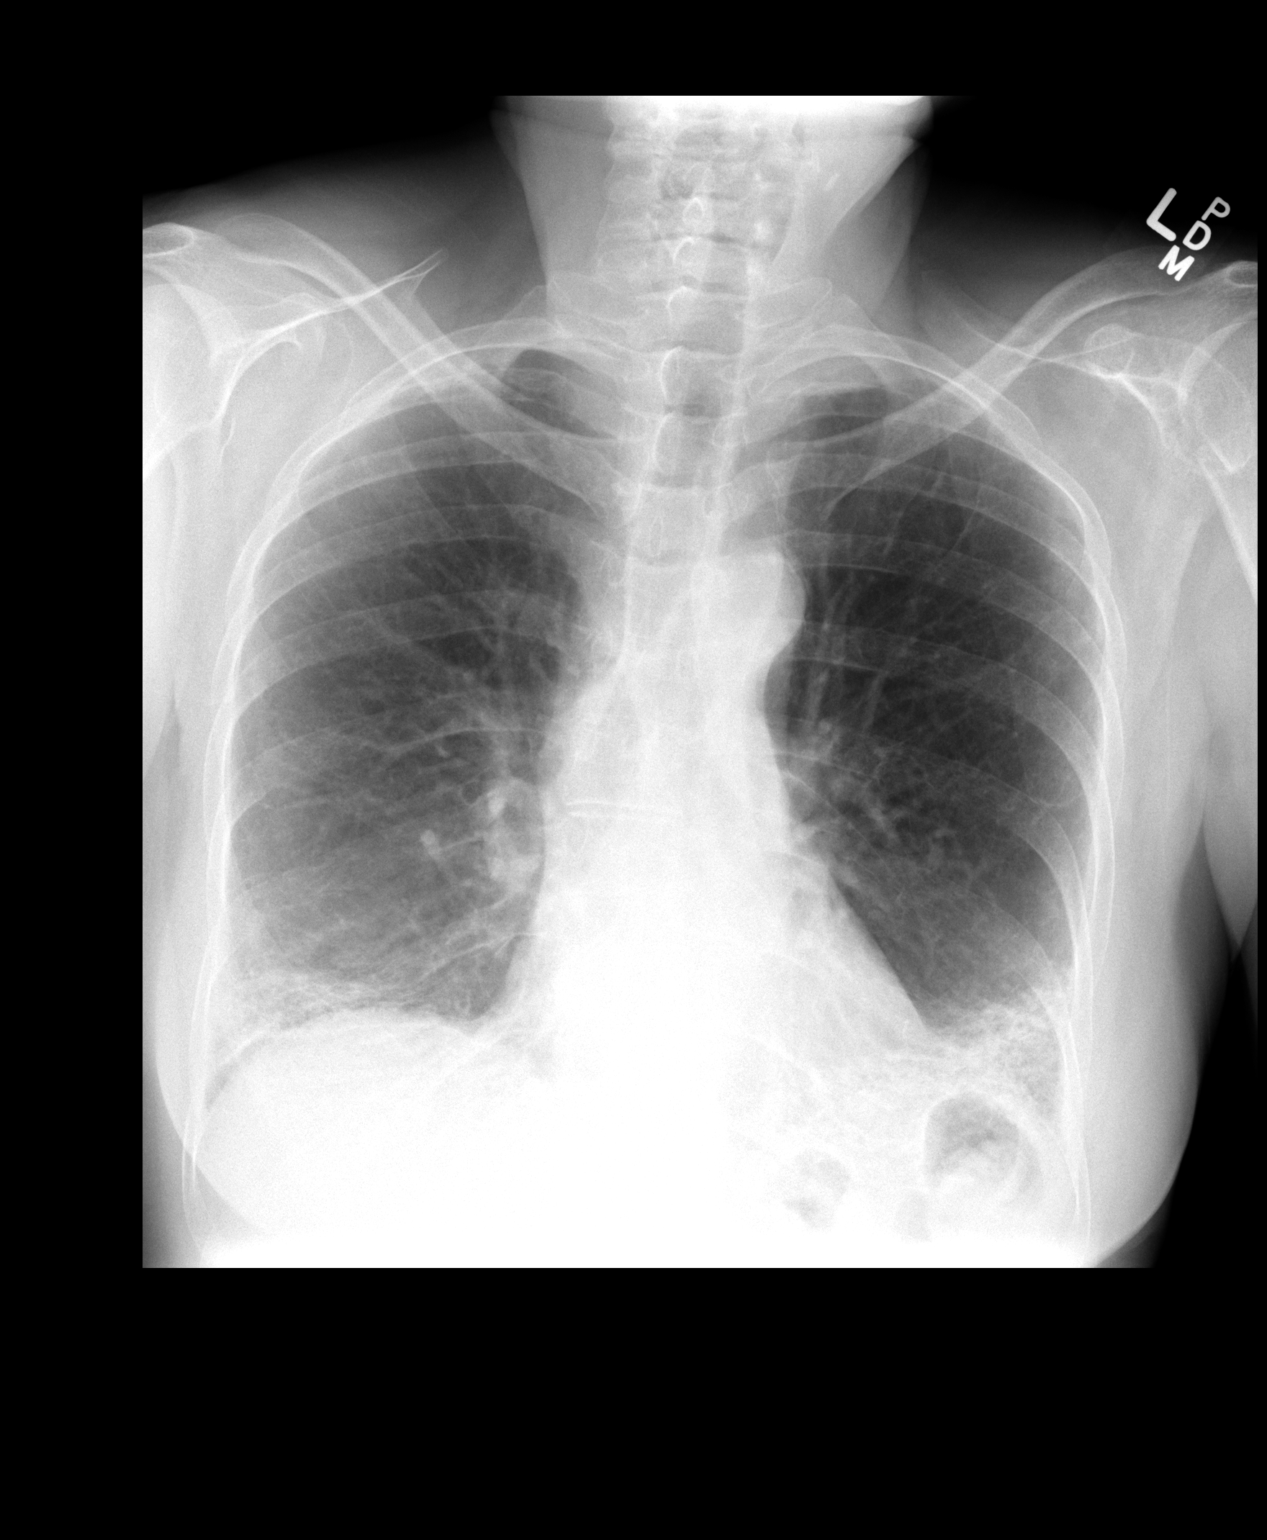

[view not recorded (2 of 2)]
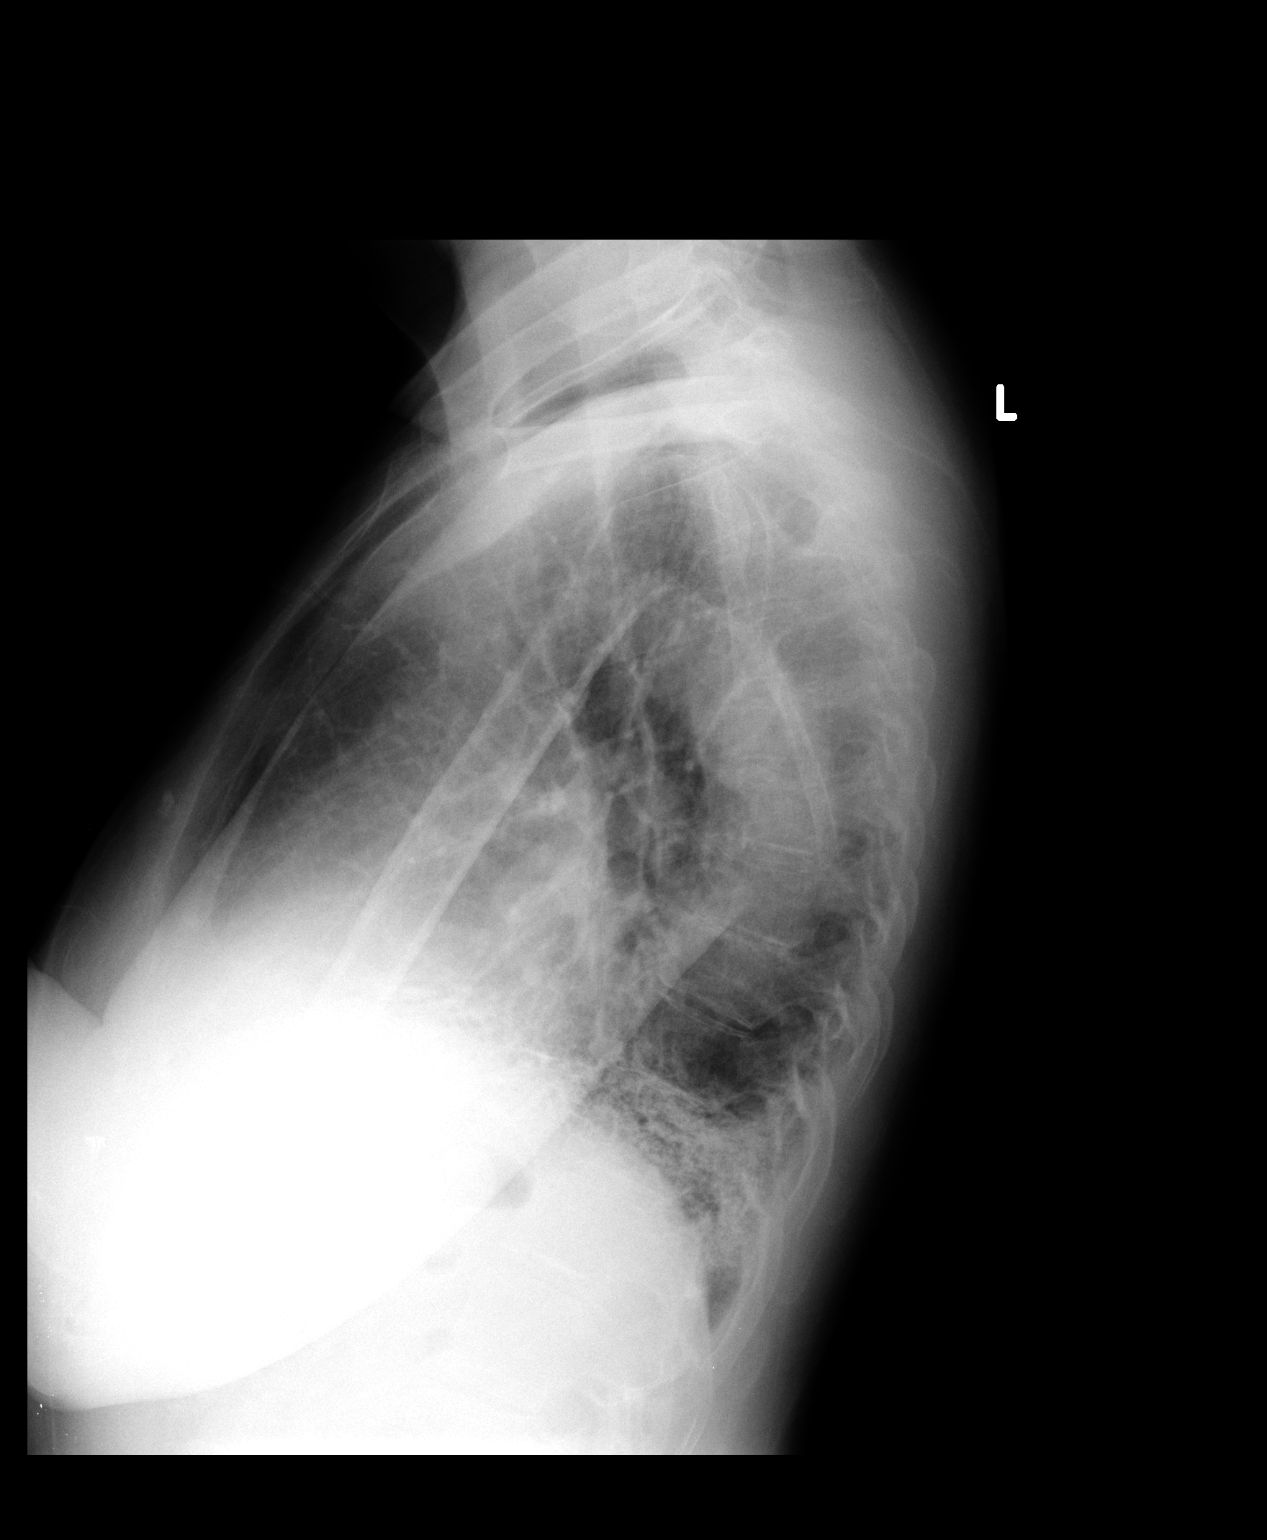

[2 of 2 positions shown; findings below may reference images not displayed]

FINDINGS: Bibasilar interstitial fibrosis is stable. There is no new opacity.
The nodular opacity seen previously in the left upper lobe is not
present currently. Heart size and pulmonary vascularity are normal.
No adenopathy. No bone lesions.
IMPRESSION: Bibasilar interstitial fibrosis, stable. No new opacity. No edema or
consolidation.

## 2015-05-28 ENCOUNTER — Other Ambulatory Visit (INDEPENDENT_AMBULATORY_CARE_PROVIDER_SITE_OTHER): Payer: Self-pay | Admitting: Internal Medicine

## 2015-06-14 ENCOUNTER — Telehealth: Payer: Self-pay | Admitting: Internal Medicine

## 2015-06-14 NOTE — Telephone Encounter (Signed)
Error.Stanley A Dalton ° °

## 2015-06-24 ENCOUNTER — Other Ambulatory Visit: Payer: Self-pay

## 2015-06-24 DIAGNOSIS — Z1231 Encounter for screening mammogram for malignant neoplasm of breast: Secondary | ICD-10-CM

## 2015-08-05 ENCOUNTER — Ambulatory Visit
Admission: RE | Admit: 2015-08-05 | Discharge: 2015-08-05 | Disposition: A | Discharge status: Home / Self Care | Payer: Medicare Other | Source: Ambulatory Visit

## 2015-08-05 DIAGNOSIS — Z1231 Encounter for screening mammogram for malignant neoplasm of breast: Secondary | ICD-10-CM

## 2015-08-12 ENCOUNTER — Encounter: Payer: Self-pay | Admitting: Internal Medicine

## 2015-08-12 ENCOUNTER — Ambulatory Visit (INDEPENDENT_AMBULATORY_CARE_PROVIDER_SITE_OTHER): Payer: Medicare Other | Admitting: Internal Medicine

## 2015-08-12 ENCOUNTER — Ambulatory Visit (INDEPENDENT_AMBULATORY_CARE_PROVIDER_SITE_OTHER)
Admission: RE | Admit: 2015-08-12 | Discharge: 2015-08-12 | Disposition: A | Discharge status: Home / Self Care | Payer: Medicare Other | Source: Ambulatory Visit | Attending: Internal Medicine | Admitting: Internal Medicine

## 2015-08-12 DIAGNOSIS — J8410 Pulmonary fibrosis, unspecified: Secondary | ICD-10-CM | POA: Diagnosis not present

## 2015-08-12 LAB — PULMONARY FUNCTION TEST
DL/VA % PRED: 94 %
DL/VA: 4.28 ml/min/mmHg/L
DLCO UNC: 13.55 ml/min/mmHg
DLCO unc % pred: 62 %
FEF 25-75 POST: 1.18 L/s
FEF 25-75 Pre: 1.24 L/sec
FEF2575-%CHANGE-POST: -5 %
FEF2575-%PRED-PRE: 86 %
FEF2575-%Pred-Post: 81 %
FEV1-%CHANGE-POST: -2 %
FEV1-%PRED-PRE: 86 %
FEV1-%Pred-Post: 84 %
FEV1-Post: 1.33 L
FEV1-Pre: 1.36 L
FEV1FVC-%Change-Post: 0 %
FEV1FVC-%Pred-Pre: 101 %
FEV6-%Change-Post: -3 %
FEV6-%PRED-POST: 86 %
FEV6-%Pred-Pre: 89 %
FEV6-POST: 1.69 L
FEV6-Pre: 1.75 L
FEV6FVC-%Pred-Post: 104 %
FEV6FVC-%Pred-Pre: 104 %
FVC-%Change-Post: -2 %
FVC-%PRED-PRE: 85 %
FVC-%Pred-Post: 83 %
FVC-POST: 1.71 L
FVC-Pre: 1.75 L
PRE FEV1/FVC RATIO: 78 %
PRE FEV6/FVC RATIO: 100 %
Post FEV1/FVC ratio: 78 %
Post FEV6/FVC ratio: 100 %
RV % pred: 51 %
RV: 1.12 L
TLC % pred: 59 %
TLC: 2.83 L

## 2015-08-12 NOTE — Progress Notes (Signed)
PFT performed today. 

## 2015-08-12 NOTE — Assessment & Plan Note (Addendum)
-   onset Oct 2011 assoc with rash       - CT Chest 05/09/11 Stable chronic interstitial opacities in the lung bases most  compatible with fibrosis.          Mildly prominent bilateral axillary lymph nodes, left slightly  greater than right. These are stable          - 05/24/11 Walked 3 laps @ 185 ft each stopped due to  End of test sats down to 90 RA   but no symptoms       - 07/05/2011 Walked 2 laps @ 185 ft each stopped due to desat to 86% RA at rapid pace, no symptoms       - PFT's 07/05/2011 VC   1.75   74% ratio 89,  DLCO 49% corrects to 84        - PFT's 02/05/2013  VC   1.94  75% ratio 83 and DLCO 64% corrects 85        - Methotrexate rx 07/2011>>> 12/17/2012 ? mtx toxicity       -07/04/2012  Walked RA x 3 laps @ 185 ft each stopped due to  End of study, sats ok      - 12/17/2012  Walked RA  2 laps @ 185 ft each stopped due to  desat to 75%> restarted pred and d/c mtx      - 02/05/2013  Walked RA  2 laps @ 185 ft each stopped due to  Sat 89%      - 07/27/2013  Walked RA x 3 laps @ 185 ft each stopped due to sat 86%      - 03/05/2014 PFT's VC 2.10 (107) without airflow obst, 58% and corrects 91%      -  PFT's  08/12/2015  1.72  with DLCO  62% corrects to 94%  for alv volume        - 03/05/2014  Walked RA x 1 laps @ 185 ft each stopped due to desat 80% at fast pace        - 09/08/2014  [facility] RA @ rapid pace x 3 laps @ 185 ft each stopped due to end of study, no desat, no sob    No significant progression of ILD on Rx for RA per Dr Trudie Reed > rec f/u yearly at this point is reasonable unless losing ground with ex tol or new cough/pleuritic cp etc  I had an extended discussion with the patient reviewing all relevant studies completed to date and  lasting 15 to 20 minutes of a 25 minute visit    Each maintenance medication was reviewed in detail including most importantly the difference between maintenance and prns and under what circumstances the prns are to be triggered using an action plan format that is  not reflected in the computer generated alphabetically organized AVS.    Please see instructions for details which were reviewed in writing and the patient given a copy highlighting the part that I personally wrote and discussed at today's ov.

## 2015-08-12 NOTE — Progress Notes (Signed)
Subjective:     Patient ID: Michelle Bonilla, female   DOB: Jan 11, 1941    MRN: [id number]   Brief patient profile:  62 yobf never smoked perfect health x hpb/ thyroid until Oct 2011 developed rash over chest some better with prednisone then hand swelling p stopped steroids  eval by Michelle Bonilla dx connective tissue dz so restarted on prednisone 04/2011 and referred to Pulmonary clinic by Dr Michelle Bonilla 05/2011 for ILD by CT  History of Present Illness  05/24/2011 Initial pulmonary office eval in EMR era  For ILD with no limiting sob or cough and good control of arthritis on prednisone at 5 mg three times a day.  Feels joint problems are much better. No unusual exposure hx or hx of taking chemo, amiodarone or macrodantin. rec return for pft's/cxr      09/08/2014 f/u ov/Michelle Bonilla re:  Pf assoc with connective tissue dz / still @ 5 mg pred / day Chief Complaint  Patient presents with  . Follow-up    Pt states that her breathing is doing well and denies any co's today.  until fx L shoulder 08/12/14 was walking 2h daily s limiting sob but much less active since then, treated with cast only and better now rec Return in one year for cxr and pfts unless losing ground with exercise tolerance    08/12/2015 f/u ov/Michelle Bonilla re: pf / RA  Chief Complaint  Patient presents with  . Follow-up    Here to discuss PFT and CXR today. Pt states that breathing has been easier lately. No c/o SOB, wheezing, chest pain, or cough   rapid walk  s sob   No obvious daytime variabilty or assoc chronic cough or cp or chest tightness, subjective wheeze overt sinus or hb symptoms. No unusual exp hx or h/o childhood pna/ asthma or knowledge of premature birth.       Sleeping ok without nocturnal  or early am exac of resp c/o's. Also denies any obvious fluctuation of symptoms with weather or environmental changes or other aggravating or alleviating factors.    Current Medications, Allergies, Complete Past Medical History, Past Surgical  History, Family History, and Social History were reviewed in [employer] record.  ROS  The following are not active complaints unless bolded sore throat, dysphagia, dental problems, itching, sneezing,  nasal congestion or excess/ purulent secretions, ear ache,   fever, chills, sweats, unintended wt loss, pleuritic or exertional cp, hemoptysis,  orthopnea pnd or leg swelling, presyncope, palpitations, heartburn, abdominal pain, anorexia, nausea, vomiting, diarrhea  or change in bowel or urinary habits, change in stools or urine, dysuria,hematuria,  rash, arthralgias, visual complaints, headache, numbness weakness or ataxia or problems with walking or coordination,  change in mood/affect or memory.        [name]     PMHx Pulmonary Fibrosis/ Connective Tissue dz................Marland KitchenMarland KitchenGavin Bonilla       - onset Oct 2011 assoc with rash       - Prednisone started 05/2011        - CT Chest 05/09/11 Stable chronic interstitial opacities in the lung bases most  compatible with fibrosis.          Mildly prominent bilateral axillary lymph nodes, left slightly  greater than right. These are stable           Objective:   Physical Exam  Pleasant amb bf nad     Wt 102 05/24/11 >  114 07/05/11  > 121 08/22/2011  > 04/02/2012  109 > 07/04/2012  102 > 93 12/17/2012 > 104 02/05/2013 > 07/27/2013  114 > 03/05/2014 119> 09/08/2014 114 > 08/12/2015 08/12/15 110   HEENT: nl dentition, turbinates, and orophanx. Nl external ear canals without cough reflex   NECK :  without JVD/Nodes/TM/ nl carotid upstrokes bilaterally   LUNGS: no acc muscle use, clear to A and P bilaterally without cough on insp or exp maneuvers. No sign crackles/minimal BV changes both bases R > L    CV:  RRR  no s3 or murmur,   No increase in P2, no edema   ABD:  soft and nontender with nl excursion in the supine position. No bruits or organomegaly, bowel sounds nl  MS:  warm without deformities, calf tenderness, cyanosis.  Very min   clubbing  SKIN: warm and dry without lesions / nodules        CXR PA and Lateral:   08/12/2015 :     I personally reviewed images and agree with radiology impression as follows:    Stable parenchymal densities at the lung bases are most compatible with fibrosis. No significant change from the previous examination.  No acute chest findings.  Assessment:

## 2015-08-12 NOTE — Patient Instructions (Signed)
Please schedule a follow up visit in 12 months but call sooner if needed with pfts and cxr

## 2015-08-13 ENCOUNTER — Encounter: Payer: Self-pay | Admitting: Internal Medicine

## 2015-09-30 ENCOUNTER — Other Ambulatory Visit: Payer: Self-pay | Admitting: Internal Medicine

## 2015-09-30 ENCOUNTER — Other Ambulatory Visit (INDEPENDENT_AMBULATORY_CARE_PROVIDER_SITE_OTHER): Payer: Self-pay | Admitting: Internal Medicine

## 2016-03-31 ENCOUNTER — Other Ambulatory Visit (INDEPENDENT_AMBULATORY_CARE_PROVIDER_SITE_OTHER): Payer: Self-pay | Admitting: Internal Medicine

## 2016-05-08 ENCOUNTER — Telehealth: Payer: Self-pay | Admitting: Internal Medicine

## 2016-05-08 NOTE — Telephone Encounter (Signed)
Spoke with pt. It doesn't look like we fill this medication. Advised pt that she would need to have her PCP refill this. She agreed. Nothing further was needed.

## 2016-05-17 ENCOUNTER — Telehealth (INDEPENDENT_AMBULATORY_CARE_PROVIDER_SITE_OTHER): Payer: Self-pay | Admitting: *Deleted

## 2016-05-17 NOTE — Telephone Encounter (Signed)
Patient would like [place] to call her PCP @ [phone] to let them know that it is ok for them to start writing her RX for pantoprazole

## 2016-05-17 NOTE — Telephone Encounter (Signed)
I have called the Physician's office , a message was left for Hinton Dyer , nurse with the physician. It is okay for them to resume writing Pantoprazole 40 mg prescriptions for this patient.

## 2016-07-16 ENCOUNTER — Other Ambulatory Visit: Payer: Self-pay | Admitting: Internal Medicine

## 2016-07-16 DIAGNOSIS — Z1231 Encounter for screening mammogram for malignant neoplasm of breast: Secondary | ICD-10-CM

## 2016-08-13 ENCOUNTER — Ambulatory Visit (INDEPENDENT_AMBULATORY_CARE_PROVIDER_SITE_OTHER): Payer: Medicare Other | Admitting: Internal Medicine

## 2016-08-13 ENCOUNTER — Encounter: Payer: Self-pay | Admitting: Internal Medicine

## 2016-08-13 ENCOUNTER — Ambulatory Visit (INDEPENDENT_AMBULATORY_CARE_PROVIDER_SITE_OTHER)
Admission: RE | Admit: 2016-08-13 | Discharge: 2016-08-13 | Disposition: A | Discharge status: Home / Self Care | Payer: Medicare Other | Source: Ambulatory Visit | Attending: Internal Medicine | Admitting: Internal Medicine

## 2016-08-13 ENCOUNTER — Ambulatory Visit
Admission: RE | Admit: 2016-08-13 | Discharge: 2016-08-13 | Disposition: A | Discharge status: Home / Self Care | Payer: Medicare Other | Source: Ambulatory Visit | Attending: Internal Medicine | Admitting: Internal Medicine

## 2016-08-13 ENCOUNTER — Other Ambulatory Visit: Payer: Self-pay | Admitting: Internal Medicine

## 2016-08-13 ENCOUNTER — Encounter (INDEPENDENT_AMBULATORY_CARE_PROVIDER_SITE_OTHER): Payer: Medicare Other | Admitting: Internal Medicine

## 2016-08-13 VITALS — BP 126/76 | HR 79 | Ht 62.0 in | Wt 108.0 lb

## 2016-08-13 DIAGNOSIS — R0600 Dyspnea, unspecified: Secondary | ICD-10-CM

## 2016-08-13 DIAGNOSIS — J8410 Pulmonary fibrosis, unspecified: Secondary | ICD-10-CM

## 2016-08-13 DIAGNOSIS — Z1231 Encounter for screening mammogram for malignant neoplasm of breast: Secondary | ICD-10-CM

## 2016-08-13 LAB — PULMONARY FUNCTION TEST
DL/VA % pred: 94 %
DL/VA: 4.3 ml/min/mmHg/L
DLCO unc % pred: 50 %
DLCO unc: 10.86 ml/min/mmHg
FEF 25-75 POST: 2.82 L/s
FEF 25-75 Pre: 2.61 L/sec
FEF2575-%CHANGE-POST: 8 %
FEF2575-%PRED-PRE: 186 %
FEF2575-%Pred-Post: 201 %
FEV1-%Change-Post: 2 %
FEV1-%Pred-Post: 112 %
FEV1-%Pred-Pre: 109 %
FEV1-PRE: 1.7 L
FEV1-Post: 1.74 L
FEV1FVC-%CHANGE-POST: 3 %
FEV1FVC-%Pred-Pre: 114 %
FEV6-%CHANGE-POST: -1 %
FEV6-%Pred-Post: 98 %
FEV6-%Pred-Pre: 99 %
FEV6-Post: 1.9 L
FEV6-Pre: 1.92 L
FEV6FVC-%Pred-Post: 104 %
FEV6FVC-%Pred-Pre: 104 %
FVC-%CHANGE-POST: -1 %
FVC-%Pred-Post: 93 %
FVC-%Pred-Pre: 95 %
FVC-Post: 1.9 L
FVC-Pre: 1.93 L
POST FEV1/FVC RATIO: 92 %
PRE FEV1/FVC RATIO: 88 %
Post FEV6/FVC ratio: 100 %
Pre FEV6/FVC Ratio: 100 %
RV % pred: 47 %
RV: 1.04 L
TLC % pred: 63 %
TLC: 3.03 L

## 2016-08-13 NOTE — Progress Notes (Signed)
Subjective:     Patient ID: Michelle Bonilla, female   DOB: 1941/10/29    MRN: [id number]   Brief patient profile:  17 yobf never smoked perfect health x hpb/ thyroid until Oct 2011 developed rash over chest some better with prednisone then hand swelling p stopped steroids  eval by Michelle Bonilla dx connective tissue dz so restarted on prednisone 04/2011 and referred to Pulmonary clinic by Dr Michelle Bonilla 05/2011 for ILD by CT    History of Present Illness  05/24/2011 Initial pulmonary office eval in EMR era  For ILD with no limiting sob or cough and good control of arthritis on prednisone at 5 mg three times a day.  Feels joint problems are much better. No unusual exposure hx or hx of taking chemo, amiodarone or macrodantin. rec return for pft's/cxr   09/08/2014 f/u ov/Michelle Bonilla re:  Pf assoc with connective tissue dz / still @ 5 mg pred / day Chief Complaint  Patient presents with  . Follow-up    Pt states that her breathing is doing well and denies any co's today.  until fx L shoulder 08/12/14 was walking 2h daily s limiting sob but much less active since then, treated with cast only and better now rec Return in one year for cxr and pfts unless losing ground with exercise tolerance    08/12/2015 f/u ov/Michelle Bonilla re: pf / RA  Chief Complaint  Patient presents with  . Follow-up    Here to discuss PFT and CXR today. Pt states that breathing has been easier lately. No c/o SOB, wheezing, chest pain, or cough   rapid walk  s sob  rec No change rx/   08/13/2016  f/u ov/Michelle Bonilla re: PF/ RA [place] on prednisone 5 mg daily  Chief Complaint  Patient presents with  . Follow-up    review PFT from this morning.  denies any breathing complaints.     arthritis good control - Not limited by breathing from desired activities     No obvious daytime variabilty or assoc chronic cough or cp or chest tightness, subjective wheeze overt sinus or hb symptoms. No unusual exp hx or h/o childhood pna/ asthma or knowledge of premature  birth.       Sleeping ok without nocturnal  or early am exac of resp c/o's. Also denies any obvious fluctuation of symptoms with weather or environmental changes or other aggravating or alleviating factors.    Current Medications, Allergies, Complete Past Medical History, Past Surgical History, Family History, and Social History were reviewed in [employer] record.  ROS  The following are not active complaints unless bolded sore throat, dysphagia, dental problems, itching, sneezing,  nasal congestion or excess/ purulent secretions, ear ache,   fever, chills, sweats, unintended wt loss, pleuritic or exertional cp, hemoptysis,  orthopnea pnd or leg swelling, presyncope, palpitations, heartburn, abdominal pain, anorexia, nausea, vomiting, diarrhea  or change in bowel or urinary habits, change in stools or urine, dysuria,hematuria,  rash, arthralgias, visual complaints, headache, numbness weakness or ataxia or problems with walking or coordination,  change in mood/affect or memory.        [name]     PMHx Pulmonary Fibrosis/ Connective Tissue dz................Marland KitchenMarland KitchenGavin Bonilla       - onset Oct 2011 assoc with rash       - Prednisone started 05/2011        - CT Chest 05/09/11 Stable chronic interstitial opacities in the lung bases most  compatible with fibrosis.  Mildly prominent bilateral axillary lymph nodes, left slightly  greater than right. These are stable           Objective:   Physical Exam  Pleasant amb bf nad   / vital signs reviewed   Wt 102 05/24/11 >  114 07/05/11  > 121 08/22/2011  > 04/02/2012  109 > 07/04/2012  102 > 93 12/17/2012 > 104 02/05/2013 > 07/27/2013  114 > 03/05/2014 119> 09/08/2014 114 > 08/12/2015 08/12/15 110 >  08/13/2016   108    HEENT: nl dentition, turbinates, and orophanx. Nl external ear canals without cough reflex   NECK :  without JVD/Nodes/TM/ nl carotid upstrokes bilaterally   LUNGS: no acc muscle use, clear to A and P bilaterally  without cough on insp or exp maneuvers. No sign crackles/minimal BV changes both bases R > L    CV:  RRR  no s3 or murmur,   No increase in P2, no edema   ABD:  soft and nontender with nl excursion in the supine position. No bruits or organomegaly, bowel sounds nl  MS:  warm without deformities, calf tenderness, cyanosis.  Very min  clubbing  SKIN: warm and dry without lesions / nodules   CXR PA and Lateral:   08/13/2016 :    I personally reviewed images and agree with radiology impression as follows:   Stable bibasilar fibrosis consistent with the history of rheumatoid arthritis. There is no acute cardiopulmonary abnormality.       Assessment:

## 2016-08-13 NOTE — Patient Instructions (Addendum)
Please remember to go to the  x-ray department downstairs for your tests - we will call you with the results when they are available.     Please schedule a follow up visit in 12 months but call sooner if needed with pfts and cxr

## 2016-08-13 NOTE — Progress Notes (Signed)
lmtcb for pt.  

## 2016-08-14 ENCOUNTER — Encounter: Payer: Self-pay | Admitting: Internal Medicine

## 2016-08-14 NOTE — Assessment & Plan Note (Signed)
- onset Oct 2011 assoc with rash       - CT Chest 05/09/11 Stable chronic interstitial opacities in the lung bases most  compatible with fibrosis.          Mildly prominent bilateral axillary lymph nodes, left slightly  greater than right. These are stable          - 05/24/11 Walked 3 laps @ 185 ft each stopped due to  End of test sats down to 90 RA   but no symptoms       - 07/05/2011 Walked 2 laps @ 185 ft each stopped due to desat to 86% RA at rapid pace, no symptoms       - PFT's 07/05/2011 VC   1.75   74% ratio 89,  DLCO 49% corrects to 84        - PFT's 02/05/2013  VC   1.94  75% ratio 83 and DLCO 64% corrects 85        - Methotrexate rx 07/2011>>> 12/17/2012 ? mtx toxicity       -07/04/2012  Walked RA x 3 laps @ 185 ft each stopped due to  End of study, sats ok      - 12/17/2012  Walked RA  2 laps @ 185 ft each stopped due to  desat to 75%> restarted pred and d/c mtx      - 02/05/2013  Walked RA  2 laps @ 185 ft each stopped due to  Sat 89%      - 07/27/2013  Walked RA x 3 laps @ 185 ft each stopped due to sat 86%      - 03/05/2014 PFT's VC 2.10 (107) without airflow obst, 58% and corrects 91%      -  PFT's  08/12/2015  1.72  with DLCO  62% corrects to 94%  for alv volume        - 03/05/2014  Walked RA x 1 laps @ 185 ft each stopped due to desat 80% at fast pace        - 09/08/2014  [facility] RA @ rapid pace x 3 laps @ 185 ft each stopped due to end of study, no desat, no sob       - PFT's  08/13/2016  VC 2.00 (98%)  with DLCO  50 % corrects to 94  % for alv volume  PF assoc with RA tends to mimic severity of systemic dz which is the case here, unless of course she has reaction to ra drug, which may have happened with her as well with MTX in the past.  I note her dlco is trending down slightly but she continues to have good control of her RA symptoms on low dose steroids and very good activity tol.  I did rec yearly pfts and f/u prn in meantime if losing ground with sob and let her rheumatologist know if  arthritis not well controlled  I had an extended discussion with the patient reviewing all relevant studies completed to date and  lasting 15 to 20 minutes of a 25 minute visit    Each maintenance medication was reviewed in detail including most importantly the difference between maintenance and prns and under what circumstances the prns are to be triggered using an action plan format that is not reflected in the computer generated alphabetically organized AVS.    Please see instructions for details which were reviewed in writing and the patient given a copy highlighting the part that I  personally wrote and discussed at today's ov.

## 2016-09-18 ENCOUNTER — Telehealth: Payer: Self-pay | Admitting: Internal Medicine

## 2016-09-18 NOTE — Telephone Encounter (Signed)
Spoke with pt. She is needing her pharmacy changed in her chart. This has been changed. Nothing further was needed.

## 2016-10-28 ENCOUNTER — Other Ambulatory Visit: Payer: Self-pay | Admitting: Internal Medicine

## 2016-10-31 ENCOUNTER — Other Ambulatory Visit: Payer: Self-pay | Admitting: Internal Medicine

## 2016-11-01 ENCOUNTER — Other Ambulatory Visit: Payer: Self-pay | Admitting: Internal Medicine

## 2016-11-01 ENCOUNTER — Telehealth: Payer: Self-pay | Admitting: Internal Medicine

## 2016-11-01 MED ORDER — CLONIDINE HCL 0.1 MG PO TABS: 0.1000 mg | ORAL_TABLET | Freq: Two times a day (BID) | ORAL | 11 refills | Status: DC

## 2016-11-01 NOTE — Telephone Encounter (Signed)
Spoke with pt, requesting refill on clonidine.  This has been sent to preferred pharmacy.  Nothing further needed.

## 2017-01-10 ENCOUNTER — Telehealth (INDEPENDENT_AMBULATORY_CARE_PROVIDER_SITE_OTHER): Payer: Self-pay | Admitting: Internal Medicine

## 2017-01-10 NOTE — Telephone Encounter (Signed)
Patient has an appointment on May 22 with Dr. Laural Golden.  She is concerned that she'll run out of her Pantoprazole before then.  I told her I would check to see what could be done.  I offered her an earlier appointment with Terri, but she declined, only wanted to see Dr. Laural Golden.  [phone]

## 2017-01-10 NOTE — Telephone Encounter (Signed)
Patient may have her pharmacy send to [place] a refill request,and we will give her enough to last until her office visit in May.

## 2017-01-10 NOTE — Telephone Encounter (Signed)
Patient informed. 

## 2017-02-04 ENCOUNTER — Other Ambulatory Visit: Payer: Self-pay | Admitting: Internal Medicine

## 2017-02-04 ENCOUNTER — Ambulatory Visit
Admission: RE | Admit: 2017-02-04 | Discharge: 2017-02-04 | Disposition: A | Discharge status: Home / Self Care | Payer: Medicare Other | Source: Ambulatory Visit | Attending: Internal Medicine | Admitting: Internal Medicine

## 2017-02-04 DIAGNOSIS — R0781 Pleurodynia: Secondary | ICD-10-CM

## 2017-03-02 ENCOUNTER — Other Ambulatory Visit (INDEPENDENT_AMBULATORY_CARE_PROVIDER_SITE_OTHER): Payer: Self-pay | Admitting: Internal Medicine

## 2017-03-04 NOTE — Telephone Encounter (Signed)
Patient has an appointment 02/2017. Additional refill will be given after visit.

## 2017-04-01 ENCOUNTER — Other Ambulatory Visit (INDEPENDENT_AMBULATORY_CARE_PROVIDER_SITE_OTHER): Payer: Self-pay | Admitting: Internal Medicine

## 2017-04-18 ENCOUNTER — Other Ambulatory Visit: Payer: Self-pay | Admitting: Rheumatology

## 2017-04-18 DIAGNOSIS — Z1231 Encounter for screening mammogram for malignant neoplasm of breast: Secondary | ICD-10-CM

## 2017-05-17 ENCOUNTER — Other Ambulatory Visit (INDEPENDENT_AMBULATORY_CARE_PROVIDER_SITE_OTHER): Payer: Self-pay | Admitting: *Deleted

## 2017-05-17 DIAGNOSIS — K219 Gastro-esophageal reflux disease without esophagitis: Secondary | ICD-10-CM

## 2017-05-17 MED ORDER — PANTOPRAZOLE SODIUM 40 MG PO TBEC: 40.0000 mg | DELAYED_RELEASE_TABLET | Freq: Every day | ORAL | 5 refills | Status: DC

## 2017-05-20 ENCOUNTER — Ambulatory Visit (INDEPENDENT_AMBULATORY_CARE_PROVIDER_SITE_OTHER): Payer: Medicare Other | Admitting: Internal Medicine

## 2017-05-21 ENCOUNTER — Ambulatory Visit (INDEPENDENT_AMBULATORY_CARE_PROVIDER_SITE_OTHER): Payer: Medicare Other | Admitting: Internal Medicine

## 2017-06-03 ENCOUNTER — Encounter (INDEPENDENT_AMBULATORY_CARE_PROVIDER_SITE_OTHER): Payer: Self-pay | Admitting: Internal Medicine

## 2017-06-03 ENCOUNTER — Encounter (INDEPENDENT_AMBULATORY_CARE_PROVIDER_SITE_OTHER): Payer: Self-pay

## 2017-06-03 ENCOUNTER — Ambulatory Visit (INDEPENDENT_AMBULATORY_CARE_PROVIDER_SITE_OTHER): Payer: Medicare Other | Admitting: Internal Medicine

## 2017-06-03 VITALS — BP 130/92 | HR 70 | Temp 97.2°F | Resp 18 | Ht 64.0 in | Wt 105.6 lb

## 2017-06-03 DIAGNOSIS — K219 Gastro-esophageal reflux disease without esophagitis: Secondary | ICD-10-CM | POA: Diagnosis not present

## 2017-06-03 MED ORDER — PANTOPRAZOLE SODIUM 40 MG PO TBEC: 40.0000 mg | DELAYED_RELEASE_TABLET | Freq: Every day | ORAL | 3 refills | Status: DC

## 2017-06-03 MED ORDER — PANTOPRAZOLE SODIUM 40 MG PO TBEC: 40.0000 mg | DELAYED_RELEASE_TABLET | Freq: Every day | ORAL | 5 refills | Status: DC

## 2017-06-03 NOTE — Progress Notes (Signed)
Presenting complaint;  Follow-up for chronic GERD.  Database and Subjective:  Patient is 76 year old African-American female who has chronic GERD and is here for adjuvant visit. She has not been seen since 2014. She was not able to get PPI prescription from her primary care physician. She had EGD in March 2012 revealing ulcers at GE junction and a small sliding hiatal hernia. She states she does well with medication. If she stops this medication she has breakthrough symptoms within a day she is not having any side effects with this medication. She denies dysphagia or throat symptoms. She has good appetite and her weight has been stable. She is due for bone density later this year. She was diagnosed with idiopathic biliary fibrosis and 2012 and is under care of Dr. Melvyn Novas. She also has unspecified autoimmune disorder. She has been diagnosed with inflammatory arthritis and under care of Dr. [name]. Patient states she exercises at least 5 days each week. She walks for 1 hour and then she does muscle strengthening exercises.    Current Medications: Outpatient Encounter Prescriptions as of 06/03/2017  Medication Sig  . atenolol (TENORMIN) 50 MG tablet Take 50 mg by mouth 2 (two) times daily.   . calcium citrate-vitamin D (CITRACAL+D) 315-200 MG-UNIT per tablet Take 1 tablet by mouth 2 (two) times daily.  . cloNIDine (CATAPRES) 0.1 MG tablet Take 1 tablet (0.1 mg total) by mouth 2 (two) times daily.  . ferrous sulfate 325 (65 FE) MG tablet Take 325 mg by mouth daily with breakfast.  . gemfibrozil (LOPID) 600 MG tablet Take 600 mg by mouth 2 (two) times daily.   [name] levothyroxine (SYNTHROID, LEVOTHROID) 75 MCG tablet Take 75 mcg by mouth daily.  . pantoprazole (PROTONIX) 40 MG tablet Take 1 tablet (40 mg total) by mouth daily.  . predniSONE (DELTASONE) 5 MG tablet Take 5 mg by mouth daily.   No facility-administered encounter medications on file as of 06/03/2017.      Objective: Blood  pressure (!) 130/92, pulse 70, temperature 97.2 F (36.2 C), temperature source Oral, resp. rate 18, height 5\' 4"  (1.626 m), weight 105 lb 9.6 oz (47.9 kg). Patient is alert and in no acute distress. Conjunctiva is pink. Sclera is nonicteric Oropharyngeal mucosa is normal. No neck masses or thyromegaly noted. Cardiac exam with regular rhythm normal S1 and S2. No murmur or gallop noted. Lungs are clear to auscultation. Abdomen is symmetrical soft and nontender without organomegaly or masses. No LE edema or clubbing noted. Skin involving fingers of right hand appeared to be tight.    Assessment:  #1. Chronic GERD. Patient has history of erosive reflux esophagitis well documented on EGD of March 2012. Her symptoms relapse soon after she stops PPI. She also has sliding hiatal hernia. Given history of inflammatory arthritis I'm concerned she may have esophageal motility disorder and therefore chronic PPI therapy is appropriate.   Plan:  New prescription given for pantoprazole 40 mg by mouth every morning. Patient can try every other day and see if her symptoms can be controlled. Office visit in one year.

## 2017-06-03 NOTE — Patient Instructions (Signed)
Call if pantoprazole stops working. 

## 2017-07-08 ENCOUNTER — Encounter (INDEPENDENT_AMBULATORY_CARE_PROVIDER_SITE_OTHER): Payer: Self-pay

## 2017-08-14 ENCOUNTER — Other Ambulatory Visit: Payer: Self-pay | Admitting: Internal Medicine

## 2017-08-14 DIAGNOSIS — R0600 Dyspnea, unspecified: Secondary | ICD-10-CM

## 2017-08-15 ENCOUNTER — Ambulatory Visit
Admission: RE | Admit: 2017-08-15 | Discharge: 2017-08-15 | Disposition: A | Discharge status: Home / Self Care | Payer: Medicare Other | Source: Ambulatory Visit | Attending: Rheumatology | Admitting: Rheumatology

## 2017-08-15 ENCOUNTER — Encounter: Payer: Self-pay | Admitting: Internal Medicine

## 2017-08-15 ENCOUNTER — Ambulatory Visit (INDEPENDENT_AMBULATORY_CARE_PROVIDER_SITE_OTHER): Payer: Medicare Other | Admitting: Internal Medicine

## 2017-08-15 VITALS — BP 120/72 | HR 91 | Ht 64.0 in | Wt 103.0 lb

## 2017-08-15 DIAGNOSIS — R0600 Dyspnea, unspecified: Secondary | ICD-10-CM | POA: Diagnosis not present

## 2017-08-15 DIAGNOSIS — J8410 Pulmonary fibrosis, unspecified: Secondary | ICD-10-CM

## 2017-08-15 DIAGNOSIS — Z1231 Encounter for screening mammogram for malignant neoplasm of breast: Secondary | ICD-10-CM

## 2017-08-15 LAB — PULMONARY FUNCTION TEST
DL/VA % PRED: 104 %
DL/VA: 4.75 ml/min/mmHg/L
DLCO COR % PRED: 55 %
DLCO UNC % PRED: 51 %
DLCO cor: 11.9 ml/min/mmHg
DLCO unc: 11.06 ml/min/mmHg
FEF 25-75 POST: 2.48 L/s
FEF 25-75 PRE: 1.99 L/s
FEF2575-%CHANGE-POST: 24 %
FEF2575-%PRED-POST: 181 %
FEF2575-%Pred-Pre: 145 %
FEV1-%CHANGE-POST: 0 %
FEV1-%PRED-POST: 110 %
FEV1-%Pred-Pre: 109 %
FEV1-PRE: 1.68 L
FEV1-Post: 1.69 L
FEV1FVC-%CHANGE-POST: 7 %
FEV1FVC-%PRED-PRE: 111 %
FEV6-%CHANGE-POST: -3 %
FEV6-%Pred-Post: 98 %
FEV6-%Pred-Pre: 102 %
FEV6-PRE: 1.94 L
FEV6-Post: 1.87 L
FEV6FVC-%Change-Post: 2 %
FEV6FVC-%PRED-PRE: 102 %
FEV6FVC-%Pred-Post: 105 %
FVC-%Change-Post: -6 %
FVC-%Pred-Post: 94 %
FVC-%Pred-Pre: 100 %
FVC-Post: 1.87 L
FVC-Pre: 1.99 L
POST FEV1/FVC RATIO: 91 %
PRE FEV6/FVC RATIO: 98 %
Post FEV6/FVC ratio: 100 %
Pre FEV1/FVC ratio: 84 %
RV % PRED: 59 %
RV: 1.3 L
TLC % pred: 62 %
TLC: 2.95 L

## 2017-08-15 NOTE — Progress Notes (Signed)
PFT done today. 

## 2017-08-15 NOTE — Progress Notes (Signed)
Subjective:     Patient ID: Michelle Bonilla, female   DOB: [birth year]    MRN: [id number]   Brief patient profile:  75yobf never smoked perfect health x hpb/ thyroid until Oct 2011 developed rash over chest some better with prednisone then hand swelling p stopped steroids  eval by Michelle Bonilla dx connective tissue dz so restarted on prednisone 04/2011 and referred to Pulmonary clinic by Dr Michelle Bonilla 05/2011 for ILD by CT    History of Present Illness  05/24/2011 Initial pulmonary office eval in EMR era  For ILD with no limiting sob or cough and good control of arthritis on prednisone at 5 mg three times a day.  Feels joint problems are much better. No unusual exposure hx or hx of taking chemo, amiodarone or macrodantin. rec return for pft's/cxr yearly      08/15/2017  f/u ov/Michelle Bonilla re: PF/RA Michelle Bonilla on pred 14m daily  Chief Complaint  Patient presents with  . Follow-up    1y rov- review PFT results. pt states breathing is doing well. no new symptoms or concerns.   says arthritis is good control and Not limited by breathing from desired activities    No obvious day to day or daytime variability or assoc chronic cough or cp or chest tightness, subjective wheeze or overt sinus or hb symptoms. No unusual exp hx or h/o childhood pna/ asthma or knowledge of premature birth.  Sleeping ok without nocturnal  or early am exacerbation  of respiratory  c/o's or need for noct saba. Also denies any obvious fluctuation of symptoms with weather or environmental changes or other aggravating or alleviating factors except as outlined above   Current Medications, Allergies, Complete Past Medical History, Past Surgical History, Family History, and Social History were reviewed in [employer] record.  ROS  The following are not active complaints unless bolded sore throat, dysphagia, dental problems, itching, sneezing,  nasal congestion or excess/ purulent secretions, ear ache,   fever, chills, sweats,  unintended wt loss, classically pleuritic or exertional cp, hemoptysis,  orthopnea pnd or leg swelling, presyncope, palpitations, abdominal pain, anorexia, nausea, vomiting, diarrhea  or change in bowel or bladder habits, change in stools or urine, dysuria,hematuria,  rash, arthralgias, visual complaints, headache, numbness, weakness or ataxia or problems with walking or coordination,  change in mood/affect or memory.             [name]     PMHx Pulmonary Fibrosis/ Connective Tissue dz................Marland KitchenMarland KitchenGavin Bonilla       - onset Oct 2011 assoc with rash       - Prednisone started 05/2011        - CT Chest 05/09/11 Stable chronic interstitial opacities in the lung bases most  compatible with fibrosis.          Mildly prominent bilateral axillary lymph nodes, left slightly  greater than right. These are stable           Objective:   Physical Exam  Pleasant amb bf nad   / vital signs reviewed - Note on arrival 02 sats  100% on RA     Wt 102 05/24/11 >  114 07/05/11  > 121 08/22/2011  > 04/02/2012  109 > 07/04/2012  102 > 93 12/17/2012 > 104 02/05/2013 > 07/27/2013  114 > 03/05/2014 119> 09/08/2014 114 > 08/12/2015 08/12/15 110 >  08/13/2016   108 >  08/15/2017  103    HEENT: nl dentition, turbinates, and orophanx. Nl external ear canals without  cough reflex   NECK :  without JVD/Nodes/TM/ nl carotid upstrokes bilaterally   LUNGS: no acc muscle use, clear to A and P bilaterally without cough on insp or exp maneuvers. No sign crackles/minimal BV changes both bases R > L    CV:  RRR  no s3 or murmur,   No increase in P2, no edema   ABD:  soft and nontender with nl excursion in the supine position. No bruits or organomegaly, bowel sounds nl  MS:  warm without deformities, calf tenderness, cyanosis.  Very min clubbing  SKIN: warm and dry without lesions / nodules     I personally reviewed images and agree with radiology impression as follows:  CXR:   02/04/17 Stable bibasilar and biapical fibrosis  -scarring. No definite pneumonia, CHF, nor other acute cardiopulmonary abnormality.        Assessment:

## 2017-08-15 NOTE — Patient Instructions (Signed)
No need for follow up unless worse breathing with exertion or cough lasting more than a few weeks    If you are satisfied with your treatment plan,  let your doctor know and he/she can either refill your medications or you can return here when your prescription runs out.     If in any way you are not 100% satisfied,  please tell us.  If 100% better, tell your friends!  Pulmonary follow up is as needed

## 2017-08-16 NOTE — Assessment & Plan Note (Signed)
- onset Oct 2011 assoc with rash       - CT Chest 05/09/11 Stable chronic interstitial opacities in the lung bases most  compatible with fibrosis.          Mildly prominent bilateral axillary lymph nodes, left slightly  greater than right. These are stable          - 05/24/11 Walked 3 laps @ 185 ft each stopped due to  End of test sats down to 90 RA   but no symptoms       - 07/05/2011 Walked 2 laps @ 185 ft each stopped due to desat to 86% RA at rapid pace, no symptoms       - PFT's 07/05/2011 VC   1.75   74% ratio 89,  DLCO 49% corrects to 84        - PFT's 02/05/2013  VC   1.94  75% ratio 83 and DLCO 64% corrects 85        - Methotrexate rx 07/2011>>> 12/17/2012 ? mtx toxicity       -07/04/2012  Walked RA x 3 laps @ 185 ft each stopped due to  End of study, sats ok      - 12/17/2012  Walked RA  2 laps @ 185 ft each stopped due to  desat to 75%> restarted pred and d/c mtx      - 02/05/2013  Walked RA  2 laps @ 185 ft each stopped due to  Sat 89%      - 07/27/2013  Walked RA x 3 laps @ 185 ft each stopped due to sat 86%      - 03/05/2014 PFT's VC 2.10 (107) without airflow obst, 58% and corrects 91%      -  PFT's  08/12/2015  1.72  with DLCO  62% corrects to 94%  for alv volume        - 03/05/2014  Walked RA x 1 laps @ 185 ft each stopped due to desat 80% at fast pace        - 09/08/2014  [facility] RA @ rapid pace x 3 laps @ 185 ft each stopped due to end of study, no desat, no sob       - PFT's  08/13/2016  VC 2.00 (98%)  with DLCO  50 % corrects to 94  % for alv volume        - PFT's  08/15/2017  VC  1.99 (100%) with DLCO  51/55 % corrects to 104  % for alv volume      I had an extended final summary discussion with the patient reviewing all relevant studies completed to date and  lasting 15 to 20 minutes of a 25 minute visit on the following issues:    She has a very mild form of ILD assoc with ctd which has been stable for 5 years so at this point can f/u prn sob or cough keeping in mind that the ILD may flare  with CTD flare or meds for CTD (like MTX) but if so she should have obvious symptoms and can return anytime to this clinic.  Each maintenance medication was reviewed in detail including most importantly the difference between maintenance and as needed and under what circumstances the prns are to be used.  Please see AVS for specific  Instructions which are unique to this visit and I personally typed out  which were reviewed in detail in writing with the patient and a copy  provided.    Pulmonary f/u is prn

## 2017-12-26 ENCOUNTER — Telehealth (INDEPENDENT_AMBULATORY_CARE_PROVIDER_SITE_OTHER): Payer: Self-pay | Admitting: Internal Medicine

## 2017-12-26 NOTE — Telephone Encounter (Signed)
Patient called with a progress report and she stated that she is taking her medication every other day and it is working out fine.  Thank you so much.  [phone]

## 2017-12-26 NOTE — Telephone Encounter (Signed)
This was given to [place].

## 2018-03-28 ENCOUNTER — Other Ambulatory Visit: Payer: Self-pay | Admitting: Internal Medicine

## 2018-03-28 DIAGNOSIS — Z1231 Encounter for screening mammogram for malignant neoplasm of breast: Secondary | ICD-10-CM

## 2018-06-03 ENCOUNTER — Ambulatory Visit (INDEPENDENT_AMBULATORY_CARE_PROVIDER_SITE_OTHER): Payer: Medicare Other | Admitting: Internal Medicine

## 2018-06-17 ENCOUNTER — Ambulatory Visit (INDEPENDENT_AMBULATORY_CARE_PROVIDER_SITE_OTHER): Payer: Medicare Other | Admitting: Internal Medicine

## 2018-09-04 ENCOUNTER — Ambulatory Visit
Admission: RE | Admit: 2018-09-04 | Discharge: 2018-09-04 | Disposition: A | Discharge status: Home / Self Care | Payer: Medicare Other | Source: Ambulatory Visit | Attending: Internal Medicine | Admitting: Internal Medicine

## 2018-09-04 DIAGNOSIS — Z1231 Encounter for screening mammogram for malignant neoplasm of breast: Secondary | ICD-10-CM | POA: Diagnosis not present

## 2018-09-04 DIAGNOSIS — M359 Systemic involvement of connective tissue, unspecified: Secondary | ICD-10-CM | POA: Diagnosis not present

## 2018-09-04 DIAGNOSIS — Z681 Body mass index (BMI) 19 or less, adult: Secondary | ICD-10-CM | POA: Diagnosis not present

## 2018-09-04 DIAGNOSIS — M064 Inflammatory polyarthropathy: Secondary | ICD-10-CM | POA: Diagnosis not present

## 2018-09-04 DIAGNOSIS — J849 Interstitial pulmonary disease, unspecified: Secondary | ICD-10-CM | POA: Diagnosis not present

## 2018-09-04 DIAGNOSIS — M810 Age-related osteoporosis without current pathological fracture: Secondary | ICD-10-CM | POA: Diagnosis not present

## 2018-09-04 DIAGNOSIS — R748 Abnormal levels of other serum enzymes: Secondary | ICD-10-CM | POA: Diagnosis not present

## 2018-09-04 DIAGNOSIS — Z79899 Other long term (current) drug therapy: Secondary | ICD-10-CM | POA: Diagnosis not present

## 2018-09-04 DIAGNOSIS — I7300 Raynaud's syndrome without gangrene: Secondary | ICD-10-CM | POA: Diagnosis not present

## 2018-09-18 ENCOUNTER — Other Ambulatory Visit (INDEPENDENT_AMBULATORY_CARE_PROVIDER_SITE_OTHER): Payer: Self-pay | Admitting: Internal Medicine

## 2018-09-18 DIAGNOSIS — K219 Gastro-esophageal reflux disease without esophagitis: Secondary | ICD-10-CM

## 2018-09-25 ENCOUNTER — Ambulatory Visit: Payer: Medicare Other

## 2018-11-04 DIAGNOSIS — E7800 Pure hypercholesterolemia, unspecified: Secondary | ICD-10-CM | POA: Diagnosis not present

## 2018-11-04 DIAGNOSIS — Z1389 Encounter for screening for other disorder: Secondary | ICD-10-CM | POA: Diagnosis not present

## 2018-11-04 DIAGNOSIS — M810 Age-related osteoporosis without current pathological fracture: Secondary | ICD-10-CM | POA: Diagnosis not present

## 2018-11-04 DIAGNOSIS — Z0000 Encounter for general adult medical examination without abnormal findings: Secondary | ICD-10-CM | POA: Diagnosis not present

## 2018-11-04 DIAGNOSIS — J8410 Pulmonary fibrosis, unspecified: Secondary | ICD-10-CM | POA: Diagnosis not present

## 2018-11-04 DIAGNOSIS — M359 Systemic involvement of connective tissue, unspecified: Secondary | ICD-10-CM | POA: Diagnosis not present

## 2018-11-04 DIAGNOSIS — E039 Hypothyroidism, unspecified: Secondary | ICD-10-CM | POA: Diagnosis not present

## 2018-11-04 DIAGNOSIS — I10 Essential (primary) hypertension: Secondary | ICD-10-CM | POA: Diagnosis not present

## 2018-11-28 DIAGNOSIS — H40013 Open angle with borderline findings, low risk, bilateral: Secondary | ICD-10-CM | POA: Diagnosis not present

## 2018-11-28 DIAGNOSIS — H43813 Vitreous degeneration, bilateral: Secondary | ICD-10-CM | POA: Diagnosis not present

## 2018-11-28 DIAGNOSIS — Z961 Presence of intraocular lens: Secondary | ICD-10-CM | POA: Diagnosis not present

## 2018-11-28 DIAGNOSIS — Z9849 Cataract extraction status, unspecified eye: Secondary | ICD-10-CM | POA: Diagnosis not present

## 2018-12-20 ENCOUNTER — Emergency Department (HOSPITAL_COMMUNITY): Payer: Medicare HMO

## 2018-12-20 ENCOUNTER — Observation Stay (HOSPITAL_COMMUNITY)
Admission: EM | Admit: 2018-12-20 | Discharge: 2018-12-21 | Disposition: A | Discharge status: Home / Self Care | Payer: Medicare HMO | Attending: Internal Medicine | Admitting: Internal Medicine

## 2018-12-20 ENCOUNTER — Encounter (HOSPITAL_COMMUNITY): Payer: Self-pay | Admitting: Emergency Medicine

## 2018-12-20 ENCOUNTER — Other Ambulatory Visit: Payer: Self-pay

## 2018-12-20 DIAGNOSIS — J8410 Pulmonary fibrosis, unspecified: Secondary | ICD-10-CM | POA: Diagnosis not present

## 2018-12-20 DIAGNOSIS — N189 Chronic kidney disease, unspecified: Secondary | ICD-10-CM | POA: Diagnosis present

## 2018-12-20 DIAGNOSIS — I129 Hypertensive chronic kidney disease with stage 1 through stage 4 chronic kidney disease, or unspecified chronic kidney disease: Secondary | ICD-10-CM | POA: Diagnosis not present

## 2018-12-20 DIAGNOSIS — R55 Syncope and collapse: Principal | ICD-10-CM | POA: Diagnosis present

## 2018-12-20 DIAGNOSIS — Z79890 Hormone replacement therapy (postmenopausal): Secondary | ICD-10-CM | POA: Insufficient documentation

## 2018-12-20 DIAGNOSIS — I10 Essential (primary) hypertension: Secondary | ICD-10-CM | POA: Diagnosis not present

## 2018-12-20 DIAGNOSIS — R42 Dizziness and giddiness: Secondary | ICD-10-CM | POA: Diagnosis not present

## 2018-12-20 DIAGNOSIS — Z8711 Personal history of peptic ulcer disease: Secondary | ICD-10-CM | POA: Insufficient documentation

## 2018-12-20 DIAGNOSIS — Z79899 Other long term (current) drug therapy: Secondary | ICD-10-CM | POA: Insufficient documentation

## 2018-12-20 DIAGNOSIS — E785 Hyperlipidemia, unspecified: Secondary | ICD-10-CM | POA: Diagnosis present

## 2018-12-20 DIAGNOSIS — E039 Hypothyroidism, unspecified: Secondary | ICD-10-CM | POA: Diagnosis present

## 2018-12-20 DIAGNOSIS — R110 Nausea: Secondary | ICD-10-CM | POA: Diagnosis not present

## 2018-12-20 DIAGNOSIS — M069 Rheumatoid arthritis, unspecified: Secondary | ICD-10-CM | POA: Diagnosis not present

## 2018-12-20 DIAGNOSIS — R079 Chest pain, unspecified: Secondary | ICD-10-CM

## 2018-12-20 DIAGNOSIS — R4020 Unspecified coma: Secondary | ICD-10-CM | POA: Diagnosis not present

## 2018-12-20 HISTORY — DX: Chronic kidney disease, unspecified: N18.9

## 2018-12-20 HISTORY — DX: Pulmonary fibrosis, unspecified: J84.10

## 2018-12-20 LAB — CBC
HEMATOCRIT: 39.3 % (ref 36.0–46.0)
HEMOGLOBIN: 12.7 g/dL (ref 12.0–15.0)
MCH: 27.1 pg (ref 26.0–34.0)
MCHC: 32.3 g/dL (ref 30.0–36.0)
MCV: 84 fL (ref 80.0–100.0)
Platelets: 357 10*3/uL (ref 150–400)
RBC: 4.68 MIL/uL (ref 3.87–5.11)
RDW: 14.1 % (ref 11.5–15.5)
WBC: 9.4 10*3/uL (ref 4.0–10.5)
nRBC: 0 % (ref 0.0–0.2)

## 2018-12-20 LAB — URINALYSIS, ROUTINE W REFLEX MICROSCOPIC
Bilirubin Urine: NEGATIVE
Glucose, UA: NEGATIVE mg/dL
HGB URINE DIPSTICK: NEGATIVE
Ketones, ur: NEGATIVE mg/dL
Nitrite: NEGATIVE
PH: 6 (ref 5.0–8.0)
Protein, ur: NEGATIVE mg/dL
Specific Gravity, Urine: <1.005 — ABNORMAL LOW (ref 1.005–1.030)

## 2018-12-20 LAB — BASIC METABOLIC PANEL
Anion gap: 15 (ref 5–15)
BUN: 20 mg/dL (ref 8–23)
CHLORIDE: 101 mmol/L (ref 98–111)
CO2: 22 mmol/L (ref 22–32)
CREATININE: 1.26 mg/dL — AB (ref 0.44–1.00)
Calcium: 10.3 mg/dL (ref 8.9–10.3)
GFR calc non Af Amer: 41 mL/min — ABNORMAL LOW (ref >60)
GFR, EST AFRICAN AMERICAN: 48 mL/min — AB (ref >60)
GLUCOSE: 141 mg/dL — AB (ref 70–99)
POTASSIUM: 4.3 mmol/L (ref 3.5–5.1)
Sodium: 138 mmol/L (ref 135–145)

## 2018-12-20 LAB — CBG MONITORING, ED: GLUCOSE-CAPILLARY: 83 mg/dL (ref 70–99)

## 2018-12-20 LAB — TSH: TSH: 0.167 u[IU]/mL — ABNORMAL LOW (ref 0.350–4.500)

## 2018-12-20 LAB — I-STAT TROPONIN, ED: Troponin i, poc: 0.06 ng/mL (ref 0.00–0.08)

## 2018-12-20 LAB — URINALYSIS, MICROSCOPIC (REFLEX): RBC / HPF: NONE SEEN RBC/hpf (ref 0–5)

## 2018-12-20 LAB — TROPONIN I
Troponin I: 0.04 ng/mL (ref <0.03)
Troponin I: 0.04 ng/mL (ref <0.03)

## 2018-12-20 MED ORDER — LEVOTHYROXINE SODIUM 75 MCG PO TABS
75.0000 ug | ORAL_TABLET | Freq: Every day | ORAL | Status: DC
  Administered 2018-12-21: 75 ug via ORAL
  Filled 2018-12-20: qty 1

## 2018-12-20 MED ORDER — PANTOPRAZOLE SODIUM 40 MG PO TBEC
40.0000 mg | DELAYED_RELEASE_TABLET | ORAL | Status: DC
  Filled 2018-12-20: qty 1

## 2018-12-20 MED ORDER — PREDNISONE 5 MG PO TABS
5.0000 mg | ORAL_TABLET | Freq: Every day | ORAL | Status: DC
  Administered 2018-12-21: 5 mg via ORAL
  Filled 2018-12-20: qty 1

## 2018-12-20 MED ORDER — ENOXAPARIN SODIUM 40 MG/0.4ML ~~LOC~~ SOLN
40.0000 mg | SUBCUTANEOUS | Status: DC
  Filled 2018-12-20: qty 0.4

## 2018-12-20 MED ORDER — SODIUM CHLORIDE 0.9% FLUSH
3.0000 mL | Freq: Two times a day (BID) | INTRAVENOUS | Status: DC
  Administered 2018-12-20: 3 mL via INTRAVENOUS

## 2018-12-20 MED ORDER — CLONIDINE HCL 0.1 MG PO TABS
0.1000 mg | ORAL_TABLET | Freq: Two times a day (BID) | ORAL | Status: DC
  Administered 2018-12-20 – 2018-12-21 (×2): 0.1 mg via ORAL
  Filled 2018-12-20 (×2): qty 1

## 2018-12-20 MED ORDER — GEMFIBROZIL 600 MG PO TABS
600.0000 mg | ORAL_TABLET | Freq: Two times a day (BID) | ORAL | Status: DC
  Administered 2018-12-20 – 2018-12-21 (×2): 600 mg via ORAL
  Filled 2018-12-20 (×4): qty 1

## 2018-12-20 MED ORDER — SODIUM CHLORIDE 0.9 % IV BOLUS
1000.0000 mL | Freq: Once | INTRAVENOUS | Status: AC
  Administered 2018-12-20: 1000 mL via INTRAVENOUS

## 2018-12-20 MED ORDER — ATENOLOL 50 MG PO TABS
50.0000 mg | ORAL_TABLET | Freq: Two times a day (BID) | ORAL | Status: DC
  Administered 2018-12-20 – 2018-12-21 (×2): 50 mg via ORAL
  Filled 2018-12-20 (×4): qty 1

## 2018-12-20 NOTE — ED Provider Notes (Signed)
[place] EMERGENCY DEPARTMENT Provider Note   CSN: [id number] Arrival date & time: 12/20/18  1036     History   Chief Complaint Chief Complaint  Patient presents with  . Weakness  . Loss of Consciousness    HPI Michelle Bonilla is a 77 y.o. female.  The history is provided by the patient.  Loss of Consciousness   This is a new problem. The current episode started 1 to 2 hours ago. The problem occurs rarely. The problem has been resolved. She lost consciousness for a period of 1 to 5 minutes. The problem is associated with normal activity. Associated symptoms include diaphoresis, light-headedness and nausea. Pertinent negatives include abdominal pain, back pain, bladder incontinence, chest pain, congestion, dizziness, fever, focal weakness, malaise/fatigue, palpitations, seizures, slurred speech, vertigo, visual change and vomiting. She has tried bed rest for the symptoms. The treatment provided moderate relief. Her past medical history is significant for HTN.    Past Medical History:  Diagnosis Date  . Connective tissue disease ([place])   . Hypertension   . Hypothyroid 03/06/2011   per patient  . Inflammatory arthritis   . PUD (peptic ulcer disease)    Dr Dereck Leep    Patient Active Problem List   Diagnosis Date Noted  . Syncope and collapse 12/20/2018  . Hypothyroidism 12/20/2018  . Hyperlipidemia 12/20/2018  . Pain in joint, shoulder region 08/31/2014  . Decreased range of motion of left shoulder 08/31/2014  . Muscle weakness (generalized) 08/31/2014  . Fracture of proximal end of left humerus 08/13/2014  . Essential hypertension 04/02/2012  . Pulmonary fibrosis assoc with RA 05/24/2011    Past Surgical History:  Procedure Laterality Date  . Colonoscopy    . COLONOSCOPY N/A 10/07/2013   Procedure: COLONOSCOPY;  Surgeon: Rogene Houston, MD;  Location: [facility];  Service: Endoscopy;  Laterality: N/A;  1200  . Esophagogstroduodenoscopy        OB History   No obstetric history on file.      Home Medications    Prior to Admission medications   Medication Sig Start Date End Date Taking? Authorizing Provider  acetaminophen (TYLENOL) 650 MG CR tablet Take 650 mg by mouth every 8 (eight) hours as needed for pain.   Yes [provider]  atenolol (TENORMIN) 50 MG tablet Take 50 mg by mouth 2 (two) times daily.    Yes [provider]  cholecalciferol (VITAMIN D3) 25 MCG (1000 UT) tablet Take 1,000 Units by mouth daily.   Yes [provider]  cloNIDine (CATAPRES) 0.1 MG tablet Take 1 tablet (0.1 mg total) by mouth 2 (two) times daily. 11/01/16  Yes Tanda Rockers, MD  gemfibrozil (LOPID) 600 MG tablet Take 600 mg by mouth 2 (two) times daily.  01/14/13  Yes [provider]  levothyroxine (SYNTHROID, LEVOTHROID) 75 MCG tablet Take 75 mcg by mouth daily.   Yes [provider]  pantoprazole (PROTONIX) 40 MG tablet TAKE (1) TABLET BY MOUTH ONCE DAILY. Patient taking differently: Take 40 mg by mouth every other day.  09/24/18  Yes Rehman, Mechele Dawley, MD  predniSONE (DELTASONE) 5 MG tablet Take 5 mg by mouth daily.   Yes [provider]    Family History Family History  Problem Relation Age of Onset  . Breast cancer Sister   . Bone cancer Sister     Social History Social History   Tobacco Use  . Smoking status: Never Smoker  . Smokeless tobacco: Never Used  Substance Use Topics  . Alcohol use: No  . Drug use: No     Allergies   Patient has no known allergies.   Review of Systems Review of Systems  Constitutional: Positive for diaphoresis. Negative for chills, fever and malaise/fatigue.  HENT: Negative for congestion, ear pain and sore throat.   Eyes: Negative for pain and visual disturbance.  Respiratory: Negative for cough and shortness of breath.   Cardiovascular: Positive for syncope. Negative for chest pain and palpitations.  Gastrointestinal: Positive for  nausea. Negative for abdominal pain and vomiting.  Genitourinary: Negative for bladder incontinence, dysuria and hematuria.  Musculoskeletal: Negative for arthralgias and back pain.  Skin: Negative for color change and rash.  Neurological: Positive for light-headedness. Negative for dizziness, vertigo, focal weakness, seizures and syncope.  All other systems reviewed and are negative.    Physical Exam Updated Vital Signs  ED Triage Vitals  Enc Vitals Group     BP 12/20/18 1045 120/69     Pulse Rate 12/20/18 1045 80     Resp 12/20/18 1045 16     Temp --      Temp src --      SpO2 12/20/18 1045 100 %     Weight 12/20/18 1046 102 lb (46.3 kg)     Height --      Head Circumference --      Peak Flow --      Pain Score 12/20/18 1046 0     Pain Loc --      Pain Edu? --      Excl. in [place]? --     Physical Exam Vitals signs and nursing note reviewed.  Constitutional:      General: She is not in acute distress.    Appearance: She is well-developed.  HENT:     Head: Normocephalic and atraumatic.     Nose: Nose normal.     Mouth/Throat:     Mouth: Mucous membranes are moist.  Eyes:     Extraocular Movements: Extraocular movements intact.     Conjunctiva/sclera: Conjunctivae normal.     Pupils: Pupils are equal, round, and reactive to light.  Neck:     Musculoskeletal: Normal range of motion and neck supple.  Cardiovascular:     Rate and Rhythm: Normal rate and regular rhythm.     Pulses: Normal pulses.     Heart sounds: Normal heart sounds. No murmur.  Pulmonary:     Effort: Pulmonary effort is normal. No respiratory distress.     Breath sounds: Normal breath sounds.  Abdominal:     Palpations: Abdomen is soft.     Tenderness: There is no abdominal tenderness.  Musculoskeletal: Normal range of motion.  Skin:    General: Skin is warm and dry.     Capillary Refill: Capillary refill takes less than 2 seconds.  Neurological:     General: No focal deficit present.      Mental Status: She is alert and oriented to person, place, and time.     Sensory: No sensory deficit.     Motor: No weakness.     Coordination: Coordination normal.     Comments: 5+/5 strength, normal sensation, normal finger to nose finger, no drift  Psychiatric:        Mood and Affect: Mood normal.      ED Treatments / Results  Labs (all labs ordered are listed, but only abnormal results are displayed) Labs Reviewed  BASIC METABOLIC PANEL - Abnormal; Notable  for the following components:      Result Value   Glucose, Bld 141 (*)    Creatinine, Ser 1.26 (*)    GFR calc non Af Amer 41 (*)    GFR calc Af Amer 48 (*)    All other components within normal limits  CBC  URINALYSIS, ROUTINE W REFLEX MICROSCOPIC  CBG MONITORING, ED  I-STAT TROPONIN, ED    EKG EKG Interpretation  Date/Time:  Saturday December 20 2018 10:44:51 EST Ventricular Rate:  79 PR Interval:    QRS Duration: 117 QT Interval:  385 QTC Calculation: 442 R Axis:   46 Text Interpretation:  Sinus rhythm Nonspecific intraventricular conduction delay Baseline wander in lead(s) V6 Confirmed by Lennice Sites [phone]) on 12/20/2018 12:22:21 PM   Radiology Ct Head Wo Contrast  Result Date: 12/20/2018 CLINICAL DATA:  Altered level of consciousness, syncopal episode at home EXAM: CT HEAD WITHOUT CONTRAST TECHNIQUE: Contiguous axial images were obtained from the base of the skull through the vertex without intravenous contrast. Sagittal and coronal MPR images reconstructed from axial data set. COMPARISON:  03/04/2013 FINDINGS: Brain: Generalized atrophy. Normal ventricular morphology. No midline shift or mass effect. Small vessel chronic ischemic changes of deep cerebral white matter. No intracranial hemorrhage, mass lesion, evidence of acute infarction, or extra-axial fluid collection. Vascular: Atherosclerotic calcifications of internal carotid arteries at skull base Skull: Intact Sinuses/Orbits: Clear Other: N/A  IMPRESSION: Atrophy with small vessel chronic ischemic changes of deep cerebral white matter. No acute intracranial abnormalities. Electronically Signed   By: Lavonia Dana M.D.   On: 12/20/2018 12:18   Dg Chest Port 1 View  Result Date: 12/20/2018 CLINICAL DATA:  Syncopal episode after breakfast this morning EXAM: PORTABLE CHEST 1 VIEW COMPARISON:  Portable exam 1102 hours compared to 02/04/2017 FINDINGS: Normal heart size, mediastinal contours, and pulmonary vascularity. Atherosclerotic calcification aorta. Bibasilar fibrosis and volume loss with probable underlying emphysematous changes. No acute infiltrate, pleural effusion or pneumothorax. Asymmetric biapical scarring greater on LEFT. Bones demineralized. IMPRESSION: Bibasilar pulmonary fibrosis and volume loss. Probable COPD changes without acute infiltrate. Electronically Signed   By: Lavonia Dana M.D.   On: 12/20/2018 11:32    Procedures Procedures (including critical care time)  Medications Ordered in ED Medications  sodium chloride 0.9 % bolus 1,000 mL (0 mLs Intravenous Stopped 12/20/18 1345)     Initial Impression / Assessment and Plan / ED Course  I have reviewed the triage vital signs and the nursing notes.  Pertinent labs & imaging results that were available during my care of the patient were reviewed by me and considered in my medical decision making (see chart for details).     PRIYANKA CAUSEY is a 77 year old female with history of GERD, hypothyroidism, hypertension, inflammatory arthritis who presents to the ED with syncopal event. Patient with normal vitals.  No fever.  EKG shows possible ST elevation in anterior leads, ST depressions inferiorly and laterally that appear new.  Possibly also left bundle branch instead.  Talked with STEMI doctor on call and he states it appears to be more consistent with a left bundle branch.  Patient not having any chest pain.  Overall asymptomatic.  No need for intervention at this time.   Patient with normal neurological exam.  Patient states that she was doing her regular activity when she felt warm and lightheaded and fell to the ground.  She has no neck pain.  She does not think she hit her head.  She is overall asymptomatic now.  Has a history of similar events in the past.  Lab work showed no significant anemia, electrolyte abnormality, kidney injury.  Troponin within normal limits.  Chest x-ray showed no signs of pneumonia, pneumothorax, pleural effusion.  Head CT unremarkable.  Given age, risk factors will admit for further syncope work-up.  She would benefit from telemetry, possibly echocardiogram.  Patient remained hemodynamically stable throughout my care and admitted to medicine in stable condition.  This chart was dictated using voice recognition software.  Despite best efforts to proofread,  errors can occur which can change the documentation meaning.   Final Clinical Impressions(s) / ED Diagnoses   Final diagnoses:  Syncope and collapse    ED Discharge Orders    None       Lennice Sites, DO 12/20/18 1412

## 2018-12-20 NOTE — Progress Notes (Signed)
CRITICAL VALUE ALERT  Critical Value:  Troponin 0.04  Date & Time Notied:  12/20/18 1700  Provider Notified: yes  Orders Received/Actions taken: no new order Michelle Bonilla Loel Ro

## 2018-12-20 NOTE — H&P (Signed)
History and Physical  Michelle Bonilla KGU:[id number] DOB: Jun 16, 1941 DOA: 12/20/2018  Referring physician: Dr Ronnald Nian, ED physician PCP: Seward Carol, MD  Outpatient Specialists:   Patient Coming From: Home  Chief Complaint: Syncope  HPI: Michelle Bonilla is a 77 y.o. female with a history of hypothyroidism, hypertension, peptic ulcer disease.  Patient had a syncopal episode earlier today.  She had eaten breakfast earlier and had taken her blood pressure medications.  Blood pressure was proximally 628B to 151 systolically.  She began to feel diaphoretic and she had a syncopal episode when she was walking to the sink to get a glass of water.  She states that she was out for just a moment.  Daughter came shortly after she regained consciousness, who took her blood pressure which was still 761Y systolically.  She came to the hospital for evaluation.  Denies chest pain, shortness of breath, nausea, vomiting, fever, headache, blurred vision.  She otherwise feels well.  Emergency Department Course: Normal troponin.  Normal white count.  Creatinine 1.2  Review of Systems:   Pt denies any fevers, chills, nausea, vomiting, diarrhea, constipation, abdominal pain, shortness of breath, dyspnea on exertion, orthopnea, cough, wheezing, palpitations, headache, vision changes, lightheadedness, dizziness, melena, rectal bleeding.  Review of systems are otherwise negative  Past Medical History:  Diagnosis Date  . Connective tissue disease ([place])   . Hypertension   . Hypothyroid 03/06/2011   per patient  . Inflammatory arthritis   . PUD (peptic ulcer disease)    Dr Dereck Leep   Past Surgical History:  Procedure Laterality Date  . Colonoscopy    . COLONOSCOPY N/A 10/07/2013   Procedure: COLONOSCOPY;  Surgeon: Rogene Houston, MD;  Location: [facility];  Service: Endoscopy;  Laterality: N/A;  1200  . Esophagogstroduodenoscopy     Social History:  reports that she has never smoked. She has never  used smokeless tobacco. She reports that she does not drink alcohol or use drugs. Patient lives at home  No Known Allergies  Family History  Problem Relation Age of Onset  . Breast cancer Sister   . Bone cancer Sister     Prior to Admission medications   Medication Sig Start Date End Date Taking? Authorizing Provider  acetaminophen (TYLENOL) 650 MG CR tablet Take 650 mg by mouth every 8 (eight) hours as needed for pain.   Yes [provider]  atenolol (TENORMIN) 50 MG tablet Take 50 mg by mouth 2 (two) times daily.    Yes [provider]  cholecalciferol (VITAMIN D3) 25 MCG (1000 UT) tablet Take 1,000 Units by mouth daily.   Yes [provider]  cloNIDine (CATAPRES) 0.1 MG tablet Take 1 tablet (0.1 mg total) by mouth 2 (two) times daily. 11/01/16  Yes Tanda Rockers, MD  gemfibrozil (LOPID) 600 MG tablet Take 600 mg by mouth 2 (two) times daily.  01/14/13  Yes [provider]  levothyroxine (SYNTHROID, LEVOTHROID) 75 MCG tablet Take 75 mcg by mouth daily.   Yes [provider]  pantoprazole (PROTONIX) 40 MG tablet TAKE (1) TABLET BY MOUTH ONCE DAILY. Patient taking differently: Take 40 mg by mouth every other day.  09/24/18  Yes Rehman, Mechele Dawley, MD  predniSONE (DELTASONE) 5 MG tablet Take 5 mg by mouth daily.   Yes [provider]    Physical Exam: BP 124/68   Pulse (!) 45   Resp (!) 28   Wt 46.3 kg   SpO2 99%   BMI  17.51 kg/m   . General: Older female. Awake and alert and oriented x3. No acute cardiopulmonary distress.  [name] HEENT: Normocephalic atraumatic.  Right and left ears normal in appearance.  Pupils equal, round, reactive to light. Extraocular muscles are intact. Sclerae anicteric and noninjected.  Moist mucosal membranes. No mucosal lesions.  . Neck: Neck supple without lymphadenopathy. No carotid bruits. No masses palpated.  . Cardiovascular: Regular rate with normal S1-S2 sounds. No murmurs, rubs, gallops auscultated.  No JVD.  [name] Respiratory: Good respiratory effort with no wheezes, rales, rhonchi. Lungs clear to auscultation bilaterally.  No accessory muscle use. . Abdomen: Soft, nontender, nondistended. Active bowel sounds. No masses or hepatosplenomegaly  . Skin: No rashes, lesions, or ulcerations.  Dry, warm to touch. 2+ dorsalis pedis and radial pulses. . Musculoskeletal: No calf or leg pain. All major joints not erythematous nontender.  No upper or lower joint deformation.  Good ROM.  No contractures  . Psychiatric: Intact judgment and insight. Pleasant and cooperative. . Neurologic: No focal neurological deficits. Strength is 5/5 and symmetric in upper and lower extremities.  Cranial nerves II through XII are grossly intact.           Labs on Admission: I have personally reviewed following labs and imaging studies  CBC: Recent Labs  Lab 12/20/18 1052  WBC 9.4  HGB 12.7  HCT 39.3  MCV 84.0  PLT 124   Basic Metabolic Panel: Recent Labs  Lab 12/20/18 1052  NA 138  K 4.3  CL 101  CO2 22  GLUCOSE 141*  BUN 20  CREATININE 1.26*  CALCIUM 10.3   GFR: CrCl cannot be calculated (Unknown ideal weight.). Liver Function Tests: No results for input(s): AST, ALT, ALKPHOS, BILITOT, PROT, ALBUMIN in the last 168 hours. No results for input(s): LIPASE, AMYLASE in the last 168 hours. No results for input(s): AMMONIA in the last 168 hours. Coagulation Profile: No results for input(s): INR, PROTIME in the last 168 hours. Cardiac Enzymes: No results for input(s): CKTOTAL, CKMB, CKMBINDEX, TROPONINI in the last 168 hours. BNP (last 3 results) No results for input(s): PROBNP in the last 8760 hours. HbA1C: No results for input(s): HGBA1C in the last 72 hours. CBG: Recent Labs  Lab 12/20/18 1042  GLUCAP 83   Lipid Profile: No results for input(s): CHOL, HDL, LDLCALC, TRIG, CHOLHDL, LDLDIRECT in the last 72 hours. Thyroid Function Tests: No results for input(s): TSH, T4TOTAL, FREET4, T3FREE,  THYROIDAB in the last 72 hours. Anemia Panel: No results for input(s): VITAMINB12, FOLATE, FERRITIN, TIBC, IRON, RETICCTPCT in the last 72 hours. Urine analysis:    Component Value Date/Time   COLORURINE YELLOW 03/04/2013 [place] 03/04/2013 1428   LABSPEC 1.021 03/04/2013 1428   PHURINE 7.5 03/04/2013 1428   GLUCOSEU NEGATIVE 03/04/2013 1428   HGBUR NEGATIVE 03/04/2013 1428   BILIRUBINUR NEGATIVE 03/04/2013 1428   KETONESUR NEGATIVE 03/04/2013 1428   PROTEINUR NEGATIVE 03/04/2013 1428   UROBILINOGEN 1.0 03/04/2013 1428   NITRITE NEGATIVE 03/04/2013 1428   LEUKOCYTESUR NEGATIVE 03/04/2013 1428   Sepsis Labs: @LABRCNTIP (procalcitonin:4,lacticidven:4) )No results found for this or any previous visit (from the past 240 hour(s)).   Radiological Exams on Admission: Ct Head Wo Contrast  Result Date: 12/20/2018 CLINICAL DATA:  Altered level of consciousness, syncopal episode at home EXAM: CT HEAD WITHOUT CONTRAST TECHNIQUE: Contiguous axial images were obtained from the base of the skull through the vertex without intravenous contrast. Sagittal and coronal MPR images reconstructed from axial data set. COMPARISON:  03/04/2013 FINDINGS: Brain: Generalized atrophy. Normal ventricular morphology. No midline shift or mass effect. Small vessel chronic ischemic changes of deep cerebral white matter. No intracranial hemorrhage, mass lesion, evidence of acute infarction, or extra-axial fluid collection. Vascular: Atherosclerotic calcifications of internal carotid arteries at skull base Skull: Intact Sinuses/Orbits: Clear Other: N/A IMPRESSION: Atrophy with small vessel chronic ischemic changes of deep cerebral white matter. No acute intracranial abnormalities. Electronically Signed   By: Lavonia Dana M.D.   On: 12/20/2018 12:18   Dg Chest Port 1 View  Result Date: 12/20/2018 CLINICAL DATA:  Syncopal episode after breakfast this morning EXAM: PORTABLE CHEST 1 VIEW COMPARISON:  Portable  exam 1102 hours compared to 02/04/2017 FINDINGS: Normal heart size, mediastinal contours, and pulmonary vascularity. Atherosclerotic calcification aorta. Bibasilar fibrosis and volume loss with probable underlying emphysematous changes. No acute infiltrate, pleural effusion or pneumothorax. Asymmetric biapical scarring greater on LEFT. Bones demineralized. IMPRESSION: Bibasilar pulmonary fibrosis and volume loss. Probable COPD changes without acute infiltrate. Electronically Signed   By: Lavonia Dana M.D.   On: 12/20/2018 11:32    EKG: Independently reviewed.  Sinus rhythm with intraventricular conduction delay.  Nonspecific ST changes in the lateral leads, which are unchanged from prior.  No acute ST changes.  Assessment/Plan: Principal Problem:   Syncope and collapse Active Problems:   Essential hypertension   Hypothyroidism   Hyperlipidemia    This patient was discussed with the ED physician, including pertinent vitals, physical exam findings, labs, and imaging.  We also discussed care given by the ED provider.  1. Syncope and collapse a. Observation on telemetry b. We will repeat troponin later this evening c. Repeat BMP and CBC in the morning d. Echocardiogram 2. Hypothyroidism a. Check TSH 3. Hypertension a. Continue antihypertensives 4. Hyperlipidemia a. Continue outpatient regimen  DVT prophylaxis: Lovenox Consultants: None Code Status: Full code Family Communication: Daughter in the room during interview and exam Disposition Plan: Patient to return home following observation   Jacob Moores [facility] Pager [phone]  If 7PM-7AM, please contact night-coverage [email or web address] Password [email or web address]

## 2018-12-20 NOTE — Plan of Care (Signed)
  Problem: Education: Goal: Knowledge of General Education information will improve Description Including pain rating scale, medication(s)/side effects and non-pharmacologic comfort measures Outcome: Adequate for Discharge   Problem: Health Behavior/Discharge Planning: Goal: Ability to manage health-related needs will improve Outcome: Adequate for Discharge   

## 2018-12-20 NOTE — ED Triage Notes (Addendum)
Pt in from home after syncopal episode. Pt remembers being very diaphoretic, having increased weakness and slow speech before passing out and lowering herself to the floor. Denies any head/neck pain or chest pain now or at time of event. Reports eating breakfast today, CBG 83, has had one episode of this before when her Prednisone doses were being adjusted. Arrives a&ox4, 142/110

## 2018-12-20 NOTE — ED Notes (Signed)
Patient is resting comfortably. 

## 2018-12-20 NOTE — ED Notes (Signed)
Family at bedside. 

## 2018-12-21 ENCOUNTER — Encounter (HOSPITAL_COMMUNITY): Payer: Self-pay | Admitting: Internal Medicine

## 2018-12-21 ENCOUNTER — Other Ambulatory Visit (HOSPITAL_COMMUNITY): Payer: Medicare HMO

## 2018-12-21 DIAGNOSIS — R55 Syncope and collapse: Secondary | ICD-10-CM | POA: Diagnosis not present

## 2018-12-21 DIAGNOSIS — N189 Chronic kidney disease, unspecified: Secondary | ICD-10-CM | POA: Diagnosis present

## 2018-12-21 DIAGNOSIS — E039 Hypothyroidism, unspecified: Secondary | ICD-10-CM | POA: Diagnosis not present

## 2018-12-21 DIAGNOSIS — M069 Rheumatoid arthritis, unspecified: Secondary | ICD-10-CM | POA: Diagnosis not present

## 2018-12-21 DIAGNOSIS — E785 Hyperlipidemia, unspecified: Secondary | ICD-10-CM | POA: Diagnosis not present

## 2018-12-21 DIAGNOSIS — Z8711 Personal history of peptic ulcer disease: Secondary | ICD-10-CM | POA: Diagnosis not present

## 2018-12-21 DIAGNOSIS — I129 Hypertensive chronic kidney disease with stage 1 through stage 4 chronic kidney disease, or unspecified chronic kidney disease: Secondary | ICD-10-CM | POA: Diagnosis not present

## 2018-12-21 DIAGNOSIS — J8410 Pulmonary fibrosis, unspecified: Secondary | ICD-10-CM | POA: Diagnosis not present

## 2018-12-21 DIAGNOSIS — Z79899 Other long term (current) drug therapy: Secondary | ICD-10-CM | POA: Diagnosis not present

## 2018-12-21 DIAGNOSIS — D464 Refractory anemia, unspecified: Secondary | ICD-10-CM | POA: Insufficient documentation

## 2018-12-21 LAB — BASIC METABOLIC PANEL
Anion gap: 11 (ref 5–15)
BUN: 17 mg/dL (ref 8–23)
CO2: 22 mmol/L (ref 22–32)
CREATININE: 1.27 mg/dL — AB (ref 0.44–1.00)
Calcium: 10.1 mg/dL (ref 8.9–10.3)
Chloride: 106 mmol/L (ref 98–111)
GFR calc non Af Amer: 41 mL/min — ABNORMAL LOW (ref >60)
GFR, EST AFRICAN AMERICAN: 47 mL/min — AB (ref >60)
Glucose, Bld: 97 mg/dL (ref 70–99)
Potassium: 4.3 mmol/L (ref 3.5–5.1)
Sodium: 139 mmol/L (ref 135–145)

## 2018-12-21 LAB — CBC
HCT: 36 % (ref 36.0–46.0)
Hemoglobin: 11.8 g/dL — ABNORMAL LOW (ref 12.0–15.0)
MCH: 27.2 pg (ref 26.0–34.0)
MCHC: 32.8 g/dL (ref 30.0–36.0)
MCV: 82.9 fL (ref 80.0–100.0)
Platelets: 326 10*3/uL (ref 150–400)
RBC: 4.34 MIL/uL (ref 3.87–5.11)
RDW: 13.9 % (ref 11.5–15.5)
WBC: 7.3 10*3/uL (ref 4.0–10.5)
nRBC: 0 % (ref 0.0–0.2)

## 2018-12-21 LAB — GLUCOSE, CAPILLARY: Glucose-Capillary: 73 mg/dL (ref 70–99)

## 2018-12-21 LAB — TROPONIN I
Troponin I: 0.06 ng/mL (ref <0.03)
Troponin I: 0.06 ng/mL (ref <0.03)

## 2018-12-21 MED ORDER — ATENOLOL 25 MG PO TABS
25.0000 mg | ORAL_TABLET | Freq: Two times a day (BID) | ORAL | Status: DC
  Filled 2018-12-21: qty 1

## 2018-12-21 MED ORDER — ENOXAPARIN SODIUM 30 MG/0.3ML ~~LOC~~ SOLN: 30.0000 mg | SUBCUTANEOUS | Status: DC

## 2018-12-21 MED ORDER — ATENOLOL 25 MG PO TABS: 25.0000 mg | ORAL_TABLET | Freq: Two times a day (BID) | ORAL | 1 refills | Status: DC

## 2018-12-21 NOTE — Progress Notes (Signed)
SATURATION QUALIFICATIONS: (This note is used to comply with regulatory documentation for home oxygen)  Patient Saturations on Room Air at Rest = 100% HR 88  Patient Saturations on Room Air while Ambulating = 100% HR 92

## 2018-12-21 NOTE — Discharge Summary (Signed)
Physician Discharge Summary  Michelle Bonilla WUJ:[id number] DOB: 19-May-1941 DOA: 12/20/2018  PCP: Seward Carol, MD  Admit date: 12/20/2018 Discharge date: 12/21/2018  Time spent: 45 minutes  Recommendations for Outpatient Follow-up:  1. Follow up with PCP 1-2 weeks for evaluation of BP control, thyroid function and kidney function.  2. Recommend outpatient echocardiogram   Discharge Diagnoses:  Principal Problem:   Syncope and collapse Active Problems:   Essential hypertension   Pulmonary fibrosis assoc with RA   Hypothyroidism   CKD (chronic kidney disease)   Hyperlipidemia   Rheumatoid arthritis ([place])   Discharge Condition: stable  Diet recommendation: heart healthy  Filed Weights   12/20/18 1046 12/20/18 1451 12/21/18 0529  Weight: 46.3 kg 45.8 kg 45 kg    History of present illness:   Michelle Bonilla is a 77 y.o. female with a history of hypothyroidism, hypertension, peptic ulcer disease, pulmonary fibrosis associated with  RA presented to ED 12/21 with cc syncope. She stated she had eaten breakfast earlier and had taken her blood pressure medications.  Blood pressure was proximally 956O to 130 systolically.  She began to feel diaphoretic and she had a syncopal episode when she was walking to the sink to get a glass of water.  She stated that she was out for just a moment.  Daughter came shortly after she regained consciousness, who took her blood pressure which was still 865H systolically.  She came to the hospital for evaluation.  Denied chest pain, shortness of breath, nausea, vomiting, fever, headache, blurred vision.  no recent illness. Denies diarrhea, vomiting decreased oral intake. No recent changes to medications  Hospital Course:  1. Syncope and collapse. Etiology unclear but history/presentation consistent with orthostatic hypotension. Chest xray with bibasilar pulmonary fibrosis and volume loss without acute infiltrate. CT head without acute abnormalities.  Troponin 0.04-0.06. Initial ekg with Sinus rhythm Nonspecific intraventricular conduction delay Baseline wander in lead(s) V6. Repeat ekg Normal sinus rhythm Incomplete left bundle branch block. TSH low. Patient ambulated 2 minutes with HR of 88 and 100% oxygen saturation level. No s/sx infection, no metabolic derangement. Will discharge with decrease in BB and recommendation for outpatient echo  2. Hypothyroidism. tsh o.167. synthroid dose 59mcg. Patient has lost weight this year. Recommend OP follow up with PCP  3. Hypertension. Fair control in hospital. Not orthostatic. Home medicine includes atenolol and clonidine. Will decrease BB to 25mg  bid. Recommend close OP follow up with PCP  4. Pulmonary fibrosis associated with RA. Stable at baseline.   5. CKD. Stage unclear. Creatinine 1.27 on admission. Chart review indicates baseline closer to 1.08. recommend OP follow up with PCP  Procedures:    Consultations:    Discharge Exam: Vitals:   12/21/18 1202 12/21/18 1230  BP: 121/68   Pulse: 89 95  Resp: 18   Temp: 97.7 F (36.5 C)   SpO2: 100% 100%    General: well nourished sitting in chair eating in no acute distress Cardiovascular: RRR no mgr no LE edema Respiratory: normal effort BS clear bilaterally no wheeze  Discharge Instructions   Discharge Instructions    Call MD for:  difficulty breathing, headache or visual disturbances   Complete by:  As directed    Call MD for:  extreme fatigue   Complete by:  As directed    Call MD for:  persistant dizziness or light-headedness   Complete by:  As directed    Diet - low sodium heart healthy   Complete by:  As directed  Discharge instructions   Complete by:  As directed    Follow up with PCP in 1-2 weeks for evaluation of BP control, thyroid function. Recommend outpatient echocardiogram as well.   Increase activity slowly   Complete by:  As directed      Allergies as of 12/21/2018   No Known Allergies     Medication  List    TAKE these medications   acetaminophen 650 MG CR tablet Commonly known as:  TYLENOL Take 650 mg by mouth every 8 (eight) hours as needed for pain.   atenolol 25 MG tablet Commonly known as:  TENORMIN Take 1 tablet (25 mg total) by mouth 2 (two) times daily. What changed:    medication strength  how much to take  when to take this   cholecalciferol 25 MCG (1000 UT) tablet Commonly known as:  VITAMIN D3 Take 1,000 Units by mouth daily.   cloNIDine 0.1 MG tablet Commonly known as:  CATAPRES Take 1 tablet (0.1 mg total) by mouth 2 (two) times daily.   gemfibrozil 600 MG tablet Commonly known as:  LOPID Take 600 mg by mouth 2 (two) times daily.   levothyroxine 75 MCG tablet Commonly known as:  SYNTHROID, LEVOTHROID Take 75 mcg by mouth daily.   pantoprazole 40 MG tablet Commonly known as:  PROTONIX TAKE (1) TABLET BY MOUTH ONCE DAILY. What changed:  See the new instructions.   predniSONE 5 MG tablet Commonly known as:  DELTASONE Take 5 mg by mouth daily.      No Known Allergies Follow-up Information    Seward Carol, MD.   Specialty:  Internal Medicine Contact information: [address] [place] [address] [phone]            The results of significant diagnostics from this hospitalization (including imaging, microbiology, ancillary and laboratory) are listed below for reference.    Significant Diagnostic Studies: Ct Head Wo Contrast  Result Date: 12/20/2018 CLINICAL DATA:  Altered level of consciousness, syncopal episode at home EXAM: CT HEAD WITHOUT CONTRAST TECHNIQUE: Contiguous axial images were obtained from the base of the skull through the vertex without intravenous contrast. Sagittal and coronal MPR images reconstructed from axial data set. COMPARISON:  03/04/2013 FINDINGS: Brain: Generalized atrophy. Normal ventricular morphology. No midline shift or mass effect. Small vessel chronic ischemic changes of deep cerebral  white matter. No intracranial hemorrhage, mass lesion, evidence of acute infarction, or extra-axial fluid collection. Vascular: Atherosclerotic calcifications of internal carotid arteries at skull base Skull: Intact Sinuses/Orbits: Clear Other: N/A IMPRESSION: Atrophy with small vessel chronic ischemic changes of deep cerebral white matter. No acute intracranial abnormalities. Electronically Signed   By: Lavonia Dana M.D.   On: 12/20/2018 12:18   Dg Chest Port 1 View  Result Date: 12/20/2018 CLINICAL DATA:  Syncopal episode after breakfast this morning EXAM: PORTABLE CHEST 1 VIEW COMPARISON:  Portable exam 1102 hours compared to 02/04/2017 FINDINGS: Normal heart size, mediastinal contours, and pulmonary vascularity. Atherosclerotic calcification aorta. Bibasilar fibrosis and volume loss with probable underlying emphysematous changes. No acute infiltrate, pleural effusion or pneumothorax. Asymmetric biapical scarring greater on LEFT. Bones demineralized. IMPRESSION: Bibasilar pulmonary fibrosis and volume loss. Probable COPD changes without acute infiltrate. Electronically Signed   By: Lavonia Dana M.D.   On: 12/20/2018 11:32    Microbiology: No results found for this or any previous visit (from the past 240 hour(s)).   Labs: Basic Metabolic Panel: Recent Labs  Lab 12/20/18 1052 12/21/18 0352  NA 138  139  K 4.3 4.3  CL 101 106  CO2 22 22  GLUCOSE 141* 97  BUN 20 17  CREATININE 1.26* 1.27*  CALCIUM 10.3 10.1   Liver Function Tests: No results for input(s): AST, ALT, ALKPHOS, BILITOT, PROT, ALBUMIN in the last 168 hours. No results for input(s): LIPASE, AMYLASE in the last 168 hours. No results for input(s): AMMONIA in the last 168 hours. CBC: Recent Labs  Lab 12/20/18 1052 12/21/18 0352  WBC 9.4 7.3  HGB 12.7 11.8*  HCT 39.3 36.0  MCV 84.0 82.9  PLT 357 326   Cardiac Enzymes: Recent Labs  Lab 12/20/18 1537 12/20/18 2135 12/21/18 0352 12/21/18 0940  TROPONINI 0.04* 0.04*  0.06* 0.06*   BNP: BNP (last 3 results) No results for input(s): BNP in the last 8760 hours.  ProBNP (last 3 results) No results for input(s): PROBNP in the last 8760 hours.  CBG: Recent Labs  Lab 12/20/18 1042 12/21/18 0746  GLUCAP 83 73       Signed:  Radene Gunning NP [facility] 12/21/2018, 1:46 PM

## 2018-12-30 ENCOUNTER — Other Ambulatory Visit: Payer: Self-pay | Admitting: Internal Medicine

## 2018-12-30 DIAGNOSIS — R55 Syncope and collapse: Secondary | ICD-10-CM | POA: Diagnosis not present

## 2018-12-30 DIAGNOSIS — E039 Hypothyroidism, unspecified: Secondary | ICD-10-CM | POA: Diagnosis not present

## 2018-12-31 HISTORY — PX: TRANSTHORACIC ECHOCARDIOGRAM: SHX275

## 2019-01-06 ENCOUNTER — Ambulatory Visit
Admission: RE | Admit: 2019-01-06 | Discharge: 2019-01-06 | Disposition: A | Discharge status: Home / Self Care | Payer: Medicare HMO | Source: Ambulatory Visit | Attending: Internal Medicine | Admitting: Internal Medicine

## 2019-01-06 DIAGNOSIS — R55 Syncope and collapse: Secondary | ICD-10-CM

## 2019-01-06 DIAGNOSIS — I6523 Occlusion and stenosis of bilateral carotid arteries: Secondary | ICD-10-CM | POA: Diagnosis not present

## 2019-01-07 ENCOUNTER — Encounter: Payer: Self-pay | Admitting: Cardiology

## 2019-01-07 ENCOUNTER — Ambulatory Visit: Payer: Medicare HMO | Admitting: Cardiology

## 2019-01-07 VITALS — BP 158/82 | HR 99 | Ht 61.0 in | Wt 100.0 lb

## 2019-01-07 DIAGNOSIS — R55 Syncope and collapse: Secondary | ICD-10-CM | POA: Diagnosis not present

## 2019-01-07 DIAGNOSIS — I10 Essential (primary) hypertension: Secondary | ICD-10-CM | POA: Diagnosis not present

## 2019-01-07 DIAGNOSIS — R9431 Abnormal electrocardiogram [ECG] [EKG]: Secondary | ICD-10-CM

## 2019-01-07 MED ORDER — CLONIDINE HCL 0.1 MG PO TABS: ORAL_TABLET | ORAL | 11 refills | Status: DC

## 2019-01-07 MED ORDER — ATENOLOL 25 MG PO TABS: ORAL_TABLET | ORAL | 6 refills | Status: AC

## 2019-01-07 MED ORDER — VALSARTAN 40 MG PO TABS: 40.0000 mg | ORAL_TABLET | Freq: Every day | ORAL | 6 refills | Status: DC

## 2019-01-07 NOTE — Patient Instructions (Addendum)
Medication Instructions:  INSTRUCTION   STARTING TONIGHT TAKE CLONIDINE NIGHT DOSE ALONG WITH ATENOLOL 25 MG AND ADD VALSARTAN 40 MG    TOMORROW  MORNING-- ( TAKE MEDICATION AFTER BREAKFAST EVERYDAY) TAKE  ATENOLOL 25 MG , DO NOT TAKE  MORNING DOSE OF CLONIDINE ,  TOMORROW NIGHT-- TAKE 25 MG ATENOLOL  ALONG WITH VALSARTAN 40 MG  ONLY TAKE CLONIDINE  IF BLOOD PRESSURE IS GREATER THAN 562 /SYSTOLIC ( TOP NUMBER)( OTHER WISE DO NT TAKE)   START THE FOLLOWING DAY   -- IN THE MORNING TAKE 50 MG ATENOLOL ( 2 TABLETS) ONLY. --  TAKE 25 MG ATENOLOL AND VALSARTAN 40 MG AT BEDTIME   If you need a refill on your cardiac medications before your next appointment, please call your pharmacy.   Lab work:  NOT NEEDED  If you have labs (blood work) drawn today and your tests are completely normal, you will receive your results only by: [name] MyChart Message (if you have MyChart) OR . A paper copy in the mail If you have any lab test that is abnormal or we need to change your treatment, we will call you to review the results.  Testing/Procedures: SCHEDULE AT [place] has requested that you have an echocardiogram. Echocardiography is a painless test that uses sound waves to create images of your heart. It provides your doctor with information about the size and shape of your heart and how well your heart's chambers and valves are working. This procedure takes approximately one hour. There are no restrictions for this procedure.    Follow-Up: At [facility], you and your health needs are our priority.  As part of our continuing mission to provide you with exceptional heart care, we have created designated Provider Care Teams.  These Care Teams include your primary Cardiologist (physician) and Advanced Practice Providers (APPs -  Physician Assistants and Nurse Practitioners) who all work together to provide you with the care you need, when you need it. .  Your  physician recommends that you schedule a follow-up appointment in [place]. .   Any Other Special Instructions Will Be Listed Below (If Applicable).

## 2019-01-07 NOTE — Progress Notes (Signed)
PCP: Seward Carol, MD  Clinic Note: Chief Complaint  Patient presents with  . New Patient (Initial Visit)    Referred after seen by PCP following admission for syncope  . Loss of Consciousness    No further episodes since ER    HPI: Michelle Bonilla is a 78 y.o. female with a PMH notable for Pulmonary Fibrosis, HTN & Hypothyroidism who presents today for hospital f/u for Syncope & at the request of Dr. Delfina Redwood.  Michelle Bonilla was referred by Dr. Delfina Redwood to see in order to evaluate recent syncopal episode with a recent echocardiogram.  Recent Hospitalizations:   12/20/2018: syncope -- plan OP Echo. -->  Was told to reduce atenolol dose to 25 mg twice daily from 50 twice daily, but was continued on clonidine 0.1 mg twice daily.  Studies Personally Reviewed - (if available, images/films reviewed: From Epic Chart or Care Everywhere)  Carotid Dopplers January 06, 2018: Mild atherosclerotic disease bilaterally. <50%.  Patent vertebral arteries with antegrade flow.  Interval History: Michelle Bonilla presents here today feeling quite well since her episode she has not had any further episodes of near syncope or syncope. --Her episode back in December was described as being shortly after breakfast and having taken her blood pressure medications, she went to stand up and go to the sink to get something to drink (had not yet anything to drink) and must have eased herself down.  She does not remember how long she was out, but her daughter called her phone and she did not answer.  When she got there the patient was awake and alert.  Upon evaluation in the emergency room she was doing fine.  She just felt globally weak afterwards.  She has not had any episodes like this since, but has been feeling globally weak and tired since then.  She has not had any passout spells but may be a little bit dizzy.  But usually she walks about an hour to 2 hours a day without difficulty.  It just takes a little while to get  going in the morning.  Usually she does not feel dizzy once she is up and moving for the day. She had a near syncopal episode about 4 years ago having had a day with being on steroids for arthritis pain and feeling lightheaded and dizzy with that, but has not had any syncopal episodes since.  She denied any rapid irregular heartbeats palpitations at the time of her passing out and has never felt any.  No TIA or amaurosis fugax symptoms per se. She is not had any chest tightness pressure with rest or exertion.  No exertional dyspnea.  No PND, orthopnea or edema.  No melena, hematochezia, hematuria, or epstaxis. No claudication.  She was somewhat surprised with the blood pressure today, stating that this morning her blood pressure was 114/68 with a heart rate of 84 bpm.  ROS: A comprehensive was performed.  Pertinent symptoms noted in HPI. Review of Systems  Constitutional: Positive for malaise/fatigue (Since her episode.).  HENT: Negative for congestion.   Respiratory: Negative for cough, shortness of breath and wheezing.   Gastrointestinal: Negative for abdominal pain and heartburn.  Musculoskeletal: Positive for joint pain.  Neurological: Positive for weakness (Generalized). Negative for dizziness, tingling and headaches.  Psychiatric/Behavioral: Negative for memory loss. The patient is not nervous/anxious and does not have insomnia.   All other systems reviewed and are negative.  I have reviewed and (if needed) personally updated the patient's problem  list, medications, allergies, past medical and surgical history, social and family history.   Past Medical History:  Diagnosis Date  . CKD (chronic kidney disease)   . Connective tissue disease ([place])   . Hypertension   . Hypothyroid 03/06/2011   per patient  . Inflammatory arthritis   . PUD (peptic ulcer disease)    Dr Dereck Leep  . Pulmonary fibrosis ([place])     Past Surgical History:  Procedure Laterality Date  . Colonoscopy    .  COLONOSCOPY N/A 10/07/2013   Procedure: COLONOSCOPY;  Surgeon: Rogene Houston, MD;  Location: [facility];  Service: Endoscopy;  Laterality: N/A;  1200  . Esophagogstroduodenoscopy      Current Meds  Medication Sig  . acetaminophen (TYLENOL) 650 MG CR tablet Take 650 mg by mouth every 8 (eight) hours as needed for pain.  . cholecalciferol (VITAMIN D3) 25 MCG (1000 UT) tablet Take 1,000 Units by mouth daily.  . cloNIDine (CATAPRES) 0.1 MG tablet ONLY USE AS NEEDED FOR BLOOD PRESSURE ABOVE 160 SYSTOLOIC  . gemfibrozil (LOPID) 600 MG tablet Take 600 mg by mouth 2 (two) times daily.   [name] levothyroxine (SYNTHROID, LEVOTHROID) 75 MCG tablet Take 75 mcg by mouth daily.  . pantoprazole (PROTONIX) 40 MG tablet TAKE (1) TABLET BY MOUTH ONCE DAILY. (Patient taking differently: Take 40 mg by mouth every other day. )  . predniSONE (DELTASONE) 5 MG tablet Take 5 mg by mouth daily.  . [DISCONTINUED] atenolol (TENORMIN) 25 MG tablet Take 1 tablet (25 mg total) by mouth 2 (two) times daily.  . [DISCONTINUED] cloNIDine (CATAPRES) 0.1 MG tablet Take 1 tablet (0.1 mg total) by mouth 2 (two) times daily.    No Known Allergies  Social History   Tobacco Use  . Smoking status: Never Smoker  . Smokeless tobacco: Never Used  Substance Use Topics  . Alcohol use: No  . 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   She is currently "single "with 1 daughter and 2 grandchildren.  Her daughter is with her today.   She walks pretty much 5 days a week for about an hour to 2 hours of the day.    family history includes Bone cancer in her sister; Breast cancer in her sister.  Wt Readings from Last 3 Encounters:  01/07/19 100 lb (45.4 kg)  12/21/18 99 lb 4.8 oz (45 kg)  08/15/17 103 lb (46.7 kg)    PHYSICAL EXAM BP (!) 158/82 (BP Location: Right Arm)   Pulse 99   Ht 5\' 1"  (1.549 m)   Wt 100 lb (45.4 kg)   BMI 18.89  kg/m  Physical Exam  Constitutional: She is oriented to person, place, and time. She appears well-developed and well-nourished. No distress.  Relatively healthy-appearing.  Well-groomed.  HENT:  Head: Normocephalic and atraumatic.  Mouth/Throat: Oropharynx is clear and moist.  Eyes: Pupils are equal, round, and reactive to light. Conjunctivae are normal. No scleral icterus.  Neck: Normal range of motion. Neck supple. No hepatojugular reflux and no JVD present. Carotid bruit is not present. No thyromegaly present.  Cardiovascular: Normal rate, regular rhythm, S2 normal and normal pulses.  Occasional extrasystoles are present. PMI is not displaced. Exam reveals distant heart sounds. Exam reveals no gallop.  No murmur heard. Borderline tachycardic, but on exam heart rate was more like 84  Pulmonary/Chest: Effort normal and breath sounds  normal. No respiratory distress. She has no wheezes. She has no rales. She exhibits no tenderness.  Abdominal: Soft. Bowel sounds are normal. She exhibits no distension. There is no abdominal tenderness. There is no rebound.  No HSM.  Musculoskeletal: Normal range of motion.        General: No edema.  Lymphadenopathy:    She has no cervical adenopathy.  Neurological: She is alert and oriented to person, place, and time. No cranial nerve deficit.  Skin: Skin is warm and dry. She is not diaphoretic. No erythema.  Psychiatric: She has a normal mood and affect. Her behavior is normal. Judgment and thought content normal.  Vitals reviewed.    Adult ECG Report  Rate: 101 ;  Rhythm: sinus tachycardia, premature atrial contractions (PAC) and IVCD, cannot exclude anterior MI, age undetermined.;   Narrative Interpretation: Stable EKG   Other studies Reviewed: Additional studies/ records that were reviewed today include:  Recent Labs:   Lab Results  Component Value Date   CREATININE 1.27 (H) 12/21/2018   BUN 17 12/21/2018   NA 139 12/21/2018   K 4.3  12/21/2018   CL 106 12/21/2018   CO2 22 12/21/2018   No results found for: CHOL, HDL, LDLCALC, LDLDIRECT, TRIG, CHOLHDL Lab Results  Component Value Date   WBC 7.3 12/21/2018   HGB 11.8 (L) 12/21/2018   HCT 36.0 12/21/2018   MCV 82.9 12/21/2018   PLT 326 12/21/2018    ASSESSMENT / PLAN: Problem List Items Addressed This Visit    Abnormal EKG    IVCD with suggestion of possible prior anterior MI in the patient is now had syncope.  Concern for possible arrhythmia.  Plan: Check 2D echocardiogram.  If there is any significant concerning findings there may need to consider further evaluation, but will wait till this result.      Relevant Orders   EKG 12-Lead   ECHOCARDIOGRAM COMPLETE   Essential hypertension (Chronic)    Her blood pressure is high today having backed off on her beta-blocker, but her rate is 99 bpm. Plan: We will go back to 50 mg atenolol in the morning and DC the morning dose of clonidine.  We will gradually then wean off the evening dose of clonidine by adding Diovan 40 mg to the 25 mg of atenolol.  Hopefully getting the clonidine out of the system will help clear any further syncopal episodes.  She had cough with ACE inhibitor several years ago, so we will therefore use ARB. I would prefer using ARB to amlodipine because of less potentiation of orthostasis.      Relevant Medications   valsartan (DIOVAN) 40 MG tablet   atenolol (TENORMIN) 25 MG tablet   cloNIDine (CATAPRES) 0.1 MG tablet   Other Relevant Orders   EKG 12-Lead   Syncope and collapse - Primary (Chronic)    The episode she described is quite consistent with positional syncope.  Obviously we cannot exclude an arrhythmia or some type of structural heart disease despite relatively normal exam. Plan: Check 2D echocardiogram and 30-day event monitor. If that episode were to happen outside of the 30-day window, would probably need to consider loop recorder.  While I do agree that there may be concern of  bradycardia with beta-blocker and clonidine, my recommendation would be to wean off of clonidine as opposed to atenolol. Plan will be to gradually add on low-dose valsartan in the evening with her evening dose of atenolol, and then continue with 50 mg of atenolol in  the morning and 20 5 at night along with 40 mg of Diovan allowing [place] to wean off the clonidine over the next 2 days.  I would like for her to have a little bit of permissive hypertension but I think the central nervous system acting portion of clonidine makes her more susceptible to positional orthostatic hypotension.  It also takes away a potential bradycardia response.      Relevant Medications   valsartan (DIOVAN) 40 MG tablet   atenolol (TENORMIN) 25 MG tablet   cloNIDine (CATAPRES) 0.1 MG tablet   Other Relevant Orders   EKG 12-Lead   ECHOCARDIOGRAM COMPLETE      I spent a total of 40 minutes with the patient and chart review. >  50% of the time was spent in direct patient consultation.   Current medicines are reviewed at length with the patient today.  (+/- concerns) n/a The following changes have been made:  see below.  Patient Instructions  Medication Instructions:  INSTRUCTION   STARTING TONIGHT TAKE CLONIDINE NIGHT DOSE ALONG WITH ATENOLOL 25 MG AND ADD VALSARTAN 40 MG    TOMORROW  MORNING-- ( TAKE MEDICATION AFTER BREAKFAST EVERYDAY) TAKE  ATENOLOL 25 MG , DO NOT TAKE  MORNING DOSE OF CLONIDINE ,  TOMORROW NIGHT-- TAKE 25 MG ATENOLOL  ALONG WITH VALSARTAN 40 MG  ONLY TAKE CLONIDINE  IF BLOOD PRESSURE IS GREATER THAN 818 /SYSTOLIC ( TOP NUMBER)( OTHER WISE DO NT TAKE)   START THE FOLLOWING DAY   -- IN THE MORNING TAKE 50 MG ATENOLOL ( 2 TABLETS) ONLY. --  TAKE 25 MG ATENOLOL AND VALSARTAN 40 MG AT BEDTIME   If you need a refill on your cardiac medications before your next appointment, please call your pharmacy.   Lab work:  NOT NEEDED  If you have labs (blood work) drawn today and your tests are  completely normal, you will receive your results only by: [name] MyChart Message (if you have MyChart) OR . A paper copy in the mail If you have any lab test that is abnormal or we need to change your treatment, we will call you to review the results.  Testing/Procedures: SCHEDULE AT [place] has requested that you have an echocardiogram. Echocardiography is a painless test that uses sound waves to create images of your heart. It provides your doctor with information about the size and shape of your heart and how well your heart's chambers and valves are working. This procedure takes approximately one hour. There are no restrictions for this procedure.    Follow-Up: At [facility], you and your health needs are our priority.  As part of our continuing mission to provide you with exceptional heart care, we have created designated Provider Care Teams.  These Care Teams include your primary Cardiologist (physician) and Advanced Practice Providers (APPs -  Physician Assistants and Nurse Practitioners) who all work together to provide you with the care you need, when you need it. .  Your physician recommends that you schedule a follow-up appointment in [place]. .   Any Other Special Instructions Will Be Listed Below (If Applicable).    Studies Ordered:   Orders Placed This Encounter  Procedures  . EKG 12-Lead  . ECHOCARDIOGRAM COMPLETE      Glenetta Hew, M.D., M.S. Interventional Cardiologist   Pager # [phone] Phone # [phone] [address] [place], [place] [address]   Thank you for choosing [facility] at  [facility]!!

## 2019-01-08 ENCOUNTER — Encounter: Payer: Self-pay | Admitting: Cardiology

## 2019-01-08 NOTE — Assessment & Plan Note (Signed)
IVCD with suggestion of possible prior anterior MI in the patient is now had syncope.  Concern for possible arrhythmia.  Plan: Check 2D echocardiogram.  If there is any significant concerning findings there may need to consider further evaluation, but will wait till this result.

## 2019-01-08 NOTE — Assessment & Plan Note (Addendum)
The episode she described is quite consistent with positional syncope.  Obviously we cannot exclude an arrhythmia or some type of structural heart disease despite relatively normal exam. Plan: Check 2D echocardiogram and 30-day event monitor. If that episode were to happen outside of the 30-day window, would probably need to consider loop recorder.  While I do agree that there may be concern of bradycardia with beta-blocker and clonidine, my recommendation would be to wean off of clonidine as opposed to atenolol. Plan will be to gradually add on low-dose valsartan in the evening with her evening dose of atenolol, and then continue with 50 mg of atenolol in the morning and 20 5 at night along with 40 mg of Diovan allowing [place] to wean off the clonidine over the next 2 days.  I would like for her to have a little bit of permissive hypertension but I think the central nervous system acting portion of clonidine makes her more susceptible to positional orthostatic hypotension.  It also takes away a potential bradycardia response.

## 2019-01-08 NOTE — Assessment & Plan Note (Signed)
Her blood pressure is high today having backed off on her beta-blocker, but her rate is 99 bpm. Plan: We will go back to 50 mg atenolol in the morning and DC the morning dose of clonidine.  We will gradually then wean off the evening dose of clonidine by adding Diovan 40 mg to the 25 mg of atenolol.  Hopefully getting the clonidine out of the system will help clear any further syncopal episodes.  She had cough with ACE inhibitor several years ago, so we will therefore use ARB. I would prefer using ARB to amlodipine because of less potentiation of orthostasis.

## 2019-01-09 ENCOUNTER — Ambulatory Visit (HOSPITAL_COMMUNITY)
Admission: RE | Admit: 2019-01-09 | Discharge: 2019-01-09 | Disposition: A | Discharge status: Home / Self Care | Payer: Medicare HMO | Source: Ambulatory Visit | Attending: Cardiology | Admitting: Cardiology

## 2019-01-09 DIAGNOSIS — R55 Syncope and collapse: Secondary | ICD-10-CM | POA: Insufficient documentation

## 2019-01-09 DIAGNOSIS — R9431 Abnormal electrocardiogram [ECG] [EKG]: Secondary | ICD-10-CM | POA: Diagnosis not present

## 2019-01-09 NOTE — Progress Notes (Signed)
*  PRELIMINARY RESULTS* Echocardiogram 2D Echocardiogram has been performed.  Michelle Bonilla 01/09/2019, 2:48 PM

## 2019-01-16 ENCOUNTER — Telehealth: Payer: Self-pay | Admitting: Cardiology

## 2019-01-16 MED ORDER — CLONIDINE HCL 0.1 MG PO TABS: 0.1000 mg | ORAL_TABLET | Freq: Two times a day (BID) | ORAL | 5 refills | Status: AC

## 2019-01-16 NOTE — Telephone Encounter (Signed)
I do not think that these Sx are related to Valsartan -- probably more related to weaning off of Clonidine. If she wants to go back on clonidine, then lets just go back to her original regimen & stop valsartan.  However, she needs to realize that clonidine will make her more susceptible to having her pass out spells.  Glenetta Hew, MD

## 2019-01-16 NOTE — Telephone Encounter (Signed)
Pt states she is calling to inquire of Dr. Ellyn Hack can put her back on clonidine. Pt states since starting Valsartan she is more tired,wheezing at times, and threw up for two days after starting. Pt states her BP has been normal. Will route to MD for recommendations.

## 2019-01-16 NOTE — Telephone Encounter (Signed)
Pt updated with Dr. Allison Quarry recommendation. Pt verbalized understanding. Orders placed. Pt also updated with ECHO results.  Notes recorded by Leonie Man, MD on 01/11/2019 at 9:42 AM EST Echocardiogram results: Very reassuring. Essentially normal.  Normal pump function with an ejection fraction of 55 to 60% (normal range is 55 to 65%. Normal wall motion. Most likely mild diastolic dysfunction/abnormal relaxation which is normal for age. (This goes along with high blood pressure). The intraventricular septum does move with dyssynergy which is consistent with the delay in the EKG that we noted. No sign of prior heart attack.  Normal valves. Nothing to explain passout spells.  Glenetta Hew, MD

## 2019-01-16 NOTE — Telephone Encounter (Signed)
New Message:    Patient calling about wanting to change some medication. Please call patient

## 2019-01-16 NOTE — Addendum Note (Signed)
Addended by: Meryl Crutch on: 01/16/2019 11:47 AM   Modules accepted: Orders

## 2019-01-21 ENCOUNTER — Telehealth: Payer: Self-pay | Admitting: *Deleted

## 2019-01-21 DIAGNOSIS — R55 Syncope and collapse: Secondary | ICD-10-CM

## 2019-01-21 DIAGNOSIS — R9431 Abnormal electrocardiogram [ECG] [EKG]: Secondary | ICD-10-CM

## 2019-01-21 NOTE — Telephone Encounter (Addendum)
-----   Message from Leonie Man, MD sent at 01/08/2019 11:36 PM EST ----- Michelle Bonilla -  order 30d event monitor -- I think I mentioned it to her, but wanted to be sure.  thnx    Left message for patient to call back -schedule event monitor.--

## 2019-01-23 ENCOUNTER — Telehealth: Payer: Self-pay | Admitting: Cardiology

## 2019-01-23 NOTE — Telephone Encounter (Signed)
Received page from patient.  Her blood pressure was elevated to 150s over 100s in the middle of the night.  She states that she was unable to sleep so decided to take her blood pressure and found it to be elevated on several successive checks.  She denies any chest pains or shortness of breath.  She has been working with Dr. Allison Quarry office to adjust her antihypertensive regimen.  I advised her to try and get some rest and recheck her blood pressure on a daily basis at a consistent time.  If her blood pressures continue to remain elevated I encouraged her to call the office for possible adjustments to her medications.  Doylene Canning, MD

## 2019-01-26 ENCOUNTER — Telehealth: Payer: Self-pay | Admitting: Cardiology

## 2019-01-26 NOTE — Telephone Encounter (Signed)
Pt c/o medication issue:  1. Name of Medication: cloNIDine (CATAPRES) 0.1 MG tablet atenolol (TENORMIN) 25 MG tablet  2. How are you currently taking this medication (dosage and times per day)?   3. Are you having a reaction (difficulty breathing--STAT)?   4. What is your medication issue?  Patient has questions about how long when will the bed and breakfast routine last and when can she resume her normal activities.

## 2019-01-26 NOTE — Telephone Encounter (Signed)
Spoke to patient she wanted to know what should she do, if she awaken in the middle of the night to stay in bed or get up. Patient states she had to get one night - this is not usual got bathroom - had episode of some sort with blood pressure- patient called doctor on call- per patient the person told her not to get up in the middle of the night.  patient states she is taking  clonidine at 6 am and atenolol after breakfast  And second dose of clonidine and atenolol at bedtime.  blood pressure today 116/72 ; Sunday 112/75 p 86, last night 130/84 p 97 and 105/70  p 90. She states she is feeling fine.  RN informed patient to still keep a log and keep appointment. Follow instruction on taking medications . Patient may get up and sit on the side of bed for a few minutes then get up an go.  RN also wanted to discuss with patient, Dr Ellyn Hack wanted her to wear monitor prior to visit. Patient states she would like to hold off until her visit .

## 2019-01-26 NOTE — Telephone Encounter (Signed)
Called, LVM advising patient to call back to discuss questions.

## 2019-02-11 ENCOUNTER — Encounter: Payer: Self-pay | Admitting: Cardiology

## 2019-02-11 ENCOUNTER — Ambulatory Visit: Payer: Medicare HMO | Admitting: Cardiology

## 2019-02-11 VITALS — BP 167/88 | HR 99 | Ht 61.0 in | Wt 99.8 lb

## 2019-02-11 DIAGNOSIS — R9431 Abnormal electrocardiogram [ECG] [EKG]: Secondary | ICD-10-CM

## 2019-02-11 DIAGNOSIS — I10 Essential (primary) hypertension: Secondary | ICD-10-CM | POA: Diagnosis not present

## 2019-02-11 DIAGNOSIS — R55 Syncope and collapse: Secondary | ICD-10-CM | POA: Diagnosis not present

## 2019-02-11 NOTE — Progress Notes (Signed)
PCP: Seward Carol, MD  Clinic Note: Chief Complaint  Patient presents with  . Follow-up    No further syncope  . Hypertension    Did not tolerate valsartan    HPI: Michelle Bonilla is a 78 y.o. female with a PMH notable for Pulmonary Fibrosis, HTN & Hypothyroidism who presents today for second follow-up consultation for Syncope.  He originally seen at the request of Dr. Delfina Redwood.  Michelle Bonilla was referred by Dr. Delfina Redwood to see in order to evaluate recent syncopal episode with a recent echocardiogram.  In ER she was told to reduce atenolol dose to 25 mg twice daily from 50 twice daily, but was continued on clonidine 0.1 mg twice daily. She was seen for initial consultation ON Jan 8 --we ordered an echocardiogram and discussed wearing a monitor.  She chose not to wear the monitor. -I also recommended coming off of clonidine and using valsartan.  Recent Hospitalizations:   No further hospitalizations  Studies Personally Reviewed - (if available, images/films reviewed: From Epic Chart or Care Everywhere)  Echo: EF 55-60%. ~ Veto Kemps DD. Septal dyssynergy 2/2 BBB. Otherwise normal .  Interval History: Michelle Bonilla presents here today stating that she has had no further episodes of syncope.  She brings in her summary of blood pressures typed out in detail.  January 8: Blood pressure 116/75, at MD office 158/82.  Was started on valsartan and told there would be no side effects. ->  Started valsartan January 9.  January 10: Vomited 2 times, January 11 vomited 3 times.  January 12 felt weak tired fatigued.  Cold-like symptoms.  Feeling anxious and nervous.  Burping and wheezing.  January 17 called the doctor's office at 9 AM.  Was concerned about symptoms with losartan.  Wanted to know if these were side effects.  Wanted to return to taking clonidine. ->  Proceed forward with echocardiogram plan to discuss laxation.  Happy to hear that she "did not have a heart attack "  January 23: Could not  sleep.  Feeling bad had to use the bathroom multiple times with bowel movements.  Felt dizzy.  Blood pressure was 165/113.  Call to on-call doctor and daughter.  Blood pressure 145/113.  Told not to get out of bed just relax.  Stayed up all night long with daughter.  January 27: Called cardiologist office wanted to return to regular routine.  (Indicated did not get message left on January 22).  Wanted to wait about wearing monitor.  She eventually was converted back to her oral regimen and stop valsartan.  Her blood pressures have been fairly stable at home ranging from 109/68-130 9/76.  Heart rates in the 80s.  She has not had any further episodes of syncope or near syncope.  No TIA or amaurosis fugax.  No chest pain or pressure.  He does still has some skipped beats but nothing more than a few seconds. This morning she said her blood pressure was 112/70 mmHg, but today is 167/88.  Still active and exercises routinely.  Walks an hour or so a day without any chest pain or pressure.  No resting or exertional dyspnea.  No PND, orthopnea or edema.  ROS: A comprehensive was performed.  Pertinent symptoms noted in HPI. Review of Systems  Constitutional: Positive for malaise/fatigue (Until stopped valsartan).  HENT: Negative for congestion.   Respiratory: Negative for cough, shortness of breath and wheezing.   Gastrointestinal: Positive for nausea and vomiting. Negative for abdominal pain and heartburn.  Multiple bowel movements.  All GI symptoms associated with being on valsartan.  Musculoskeletal: Positive for joint pain.  Neurological: Positive for weakness (Generalized). Negative for dizziness, tingling and headaches.  Psychiatric/Behavioral: Negative for memory loss. The patient is not nervous/anxious and does not have insomnia.   All other systems reviewed and are negative.  I have reviewed and (if needed) personally updated the patient's problem list, medications, allergies, past medical  and surgical history, social and family history.   Past Medical History:  Diagnosis Date  . CKD (chronic kidney disease)   . Connective tissue disease ([place])   . Hypertension   . Hypothyroid 03/06/2011   per patient  . Inflammatory arthritis   . PUD (peptic ulcer disease)    Dr Dereck Leep  . Pulmonary fibrosis ([place])     Past Surgical History:  Procedure Laterality Date  . Colonoscopy    . COLONOSCOPY N/A 10/07/2013   Procedure: COLONOSCOPY;  Surgeon: Rogene Houston, MD;  Location: [facility];  Service: Endoscopy;  Laterality: N/A;  1200  . Esophagogstroduodenoscopy    . TRANSTHORACIC ECHOCARDIOGRAM  12/2018   Echo: EF 55-60%. ~ Veto Kemps DD. Septal dyssynergy 2/2 BBB. Otherwise normal .    Current Meds  Medication Sig  . acetaminophen (TYLENOL) 650 MG CR tablet Take 650 mg by mouth every 8 (eight) hours as needed for pain.  [name] atenolol (TENORMIN) 25 MG tablet TAKE 2 TABLET IN THE MORNING  AND ONE TABLET AT BEDTIME  . cholecalciferol (VITAMIN D3) 25 MCG (1000 UT) tablet Take 1,000 Units by mouth daily.  . cloNIDine (CATAPRES) 0.1 MG tablet Take 1 tablet (0.1 mg total) by mouth 2 (two) times daily.  [name] gemfibrozil (LOPID) 600 MG tablet Take 600 mg by mouth 2 (two) times daily.   [name] levothyroxine (SYNTHROID, LEVOTHROID) 75 MCG tablet Take 75 mcg by mouth daily.  . pantoprazole (PROTONIX) 40 MG tablet TAKE (1) TABLET BY MOUTH ONCE DAILY. (Patient taking differently: Take 40 mg by mouth every other day. )  . predniSONE (DELTASONE) 1 MG tablet Take 1 mg by mouth. Pt takes 4 tablets altogether by mouth    No Known Allergies  Social History   Tobacco Use  . Smoking status: Never Smoker  . Smokeless tobacco: Never Used  Substance Use Topics  . Alcohol use: No  . 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   She is currently "single "with 1 daughter and 2 grandchildren.  Her daughter is with her today.     She walks pretty much 5 days a week for about an hour to 2 hours of the day.    family history includes Bone cancer in her sister; Breast cancer in her sister.  Wt Readings from Last 3 Encounters:  02/11/19 99 lb 12.8 oz (45.3 kg)  01/07/19 100 lb (45.4 kg)  12/21/18 99 lb 4.8 oz (45 kg)    PHYSICAL EXAM BP (!) 167/88   Pulse 99   Ht 5\' 1"  (1.549 m)   Wt 99 lb 12.8 oz (45.3 kg)   SpO2 100%   BMI 18.86 kg/m  Physical Exam  Constitutional: She is oriented to person, place, and time. She appears well-developed and well-nourished. No distress.  Relatively healthy-appearing.  Well-groomed.  HENT:  Head: Normocephalic and atraumatic.  Mouth/Throat: Oropharynx is clear and moist.  Neck: Normal range of motion. Neck supple. No hepatojugular  reflux and no JVD present. Carotid bruit is not present. No thyromegaly present.  Cardiovascular: Normal rate, regular rhythm, S1 normal, S2 normal and normal pulses.  Occasional extrasystoles are present. PMI is not displaced. Exam reveals distant heart sounds. Exam reveals no gallop.  No murmur heard. Borderline tachycardic,   Pulmonary/Chest: Effort normal and breath sounds normal. No respiratory distress. She has no wheezes. She has no rales. She exhibits no tenderness.  Abdominal: Soft.  No HSM.  Musculoskeletal: Normal range of motion.        General: No edema.  Lymphadenopathy:    She has no cervical adenopathy.  Neurological: She is alert and oriented to person, place, and time.  Skin: Skin is dry. She is not diaphoretic.  Psychiatric: She has a normal mood and affect. Her behavior is normal. Judgment and thought content normal.  Vitals reviewed.    Adult ECG Report Not checked   Other studies Reviewed: Additional studies/ records that were reviewed today include:  Recent Labs:   Lab Results  Component Value Date   CREATININE 1.27 (H) 12/21/2018   BUN 17 12/21/2018   NA 139 12/21/2018   K 4.3 12/21/2018   CL 106 12/21/2018    CO2 22 12/21/2018   No results found for: CHOL, HDL, LDLCALC, LDLDIRECT, TRIG, CHOLHDL Lab Results  Component Value Date   WBC 7.3 12/21/2018   HGB 11.8 (L) 12/21/2018   HCT 36.0 12/21/2018   MCV 82.9 12/21/2018   PLT 326 12/21/2018    ASSESSMENT / PLAN:  She remains relatively stable.  No further syncopal symptoms.  I think her symptom was probably related to vasovagal in the past.  She was reluctant to wear a monitor simply because she has not had any further symptoms.  If she has more, we may consider loop recorder. She had a normal echo with no structural abnormalities.  EKG had a interventricular conduction delay which is probably simply an electrical anomaly. She had symptoms that had never heard of with valsartan, but at this point I think she has whitecoat syndrome type tension based on her home readings.  I told her to be less rigid with checking her blood pressures and just go back to her home regimen.  We discussed potential for high and low blood pressure as noted in patient instructions.  At this point, no further recommendations as she is relatively stable.  Recommend return on an as needed basis.  Problem List Items Addressed This Visit    Abnormal EKG (Chronic)   Essential hypertension (Chronic)   Syncope and collapse - Primary (Chronic)      I spent a total of 20 minutes with the patient and chart review. >  50% of the time was spent in direct patient consultation.   Current medicines are reviewed at length with the patient today.  (+/- concerns) n/a The following changes have been made:  see below.  Patient Instructions  Medication Instructions:   NO CHANGES BUT THE RECOMMENDATION IS IF BLOOD PRESSURE IS ELEVATED IN THE AFTERNOON , JUST TAKE CLONIDINE DOSE EARLY THAT DAY. OR IF BLOOD PRESSURE IS LOW IN THE MORNING DO NOT TAKE ATENOLOL THAT MORNING.  If you need a refill on your cardiac medications before your next appointment, please call your pharmacy.    Lab work: NOT NEEEDED If you have labs (blood work) drawn today and your tests are completely normal, you will receive your results only by: [name] MyChart Message (if you have MyChart) OR . A  paper copy in the mail If you have any lab test that is abnormal or we need to change your treatment, we will call you to review the results.  Testing/Procedures:  NOT NEEDED  Follow-Up: At [facility], you and your health needs are our priority.  As part of our continuing mission to provide you with exceptional heart care, we have created designated Provider Care Teams.  These Care Teams include your primary Cardiologist (physician) and Advanced Practice Providers (APPs -  Physician Assistants and Nurse Practitioners) who all work together to provide you with the care you need, when you need it. . Your physician recommends that you schedule a follow-up appointment ON AN AS NEEDED BASIS .   Any Other Special Instructions Will Be Listed Below (If Applicable).  KEEP HYDRATED WITH FLUIDS      Summary of Echocardiogram:  Study Conclusions  - Left ventricle: The cavity size was normal. Systolic function was normal. The estimated ejection fraction was in the range of 55% to 60%. Moderate focal basal septal hypertrophy. Wall motion was normal; there were no regional wall motion abnormalities.   Findings consistent with left ventricular diastolic dysfunction, grade indeterminate. Doppler parameters are consistent with high ventricular filling pressure. (this is NORMAL for age, especially with High Blood Pressure) - Ventricular septum: Septal motion showed abnormal function and  dyssynergy. These changes are consistent with intraventricular conduction delay (the EKG abnormality) - Aortic valve: There was trivial regurgitation. - normal.  Studies Ordered:   No orders of the defined types were placed in this encounter.     Glenetta Hew, M.D., M.S. Interventional Cardiologist   Pager #  [phone] Phone # [phone] [address] [place], [place] [address]   Thank you for choosing Heart[facility] at [facility]!!

## 2019-02-11 NOTE — Patient Instructions (Addendum)
Medication Instructions:   NO CHANGES BUT THE RECOMMENDATION IS IF BLOOD PRESSURE IS ELEVATED IN THE AFTERNOON , JUST TAKE CLONIDINE DOSE EARLY THAT DAY. OR IF BLOOD PRESSURE IS LOW IN THE MORNING DO NOT TAKE ATENOLOL THAT MORNING.  If you need a refill on your cardiac medications before your next appointment, please call your pharmacy.   Lab work: NOT NEEEDED If you have labs (blood work) drawn today and your tests are completely normal, you will receive your results only by: [name] MyChart Message (if you have MyChart) OR . A paper copy in the mail If you have any lab test that is abnormal or we need to change your treatment, we will call you to review the results.  Testing/Procedures:  NOT NEEDED  Follow-Up: At [facility], you and your health needs are our priority.  As part of our continuing mission to provide you with exceptional heart care, we have created designated Provider Care Teams.  These Care Teams include your primary Cardiologist (physician) and Advanced Practice Providers (APPs -  Physician Assistants and Nurse Practitioners) who all work together to provide you with the care you need, when you need it. . Your physician recommends that you schedule a follow-up appointment ON AN AS NEEDED BASIS .   Any Other Special Instructions Will Be Listed Below (If Applicable).  KEEP HYDRATED WITH FLUIDS      Summary of Echocardiogram:  Study Conclusions  - Left ventricle: The cavity size was normal. Systolic function was normal. The estimated ejection fraction was in the range of 55% to 60%. Moderate focal basal septal hypertrophy. Wall motion was normal; there were no regional wall motion abnormalities.   Findings consistent with left ventricular diastolic dysfunction, grade indeterminate. Doppler parameters are consistent with high ventricular filling pressure. (this is NORMAL for age, especially with High Blood Pressure) - Ventricular septum: Septal motion showed abnormal  function and  dyssynergy. These changes are consistent with intraventricular conduction delay (the EKG abnormality) - Aortic valve: There was trivial regurgitation. - normal.

## 2019-02-14 ENCOUNTER — Encounter: Payer: Self-pay | Admitting: Cardiology

## 2019-03-05 DIAGNOSIS — R748 Abnormal levels of other serum enzymes: Secondary | ICD-10-CM | POA: Diagnosis not present

## 2019-03-05 DIAGNOSIS — J849 Interstitial pulmonary disease, unspecified: Secondary | ICD-10-CM | POA: Diagnosis not present

## 2019-03-05 DIAGNOSIS — M064 Inflammatory polyarthropathy: Secondary | ICD-10-CM | POA: Diagnosis not present

## 2019-03-05 DIAGNOSIS — I7300 Raynaud's syndrome without gangrene: Secondary | ICD-10-CM | POA: Diagnosis not present

## 2019-03-05 DIAGNOSIS — M359 Systemic involvement of connective tissue, unspecified: Secondary | ICD-10-CM | POA: Diagnosis not present

## 2019-03-05 DIAGNOSIS — M810 Age-related osteoporosis without current pathological fracture: Secondary | ICD-10-CM | POA: Diagnosis not present

## 2019-03-05 DIAGNOSIS — Z681 Body mass index (BMI) 19 or less, adult: Secondary | ICD-10-CM | POA: Diagnosis not present

## 2019-03-19 ENCOUNTER — Other Ambulatory Visit: Payer: Self-pay | Admitting: Internal Medicine

## 2019-03-19 DIAGNOSIS — Z1231 Encounter for screening mammogram for malignant neoplasm of breast: Secondary | ICD-10-CM

## 2019-04-21 DIAGNOSIS — I10 Essential (primary) hypertension: Secondary | ICD-10-CM | POA: Diagnosis not present

## 2019-04-21 DIAGNOSIS — M810 Age-related osteoporosis without current pathological fracture: Secondary | ICD-10-CM | POA: Diagnosis not present

## 2019-04-21 DIAGNOSIS — Z87898 Personal history of other specified conditions: Secondary | ICD-10-CM | POA: Diagnosis not present

## 2019-04-21 DIAGNOSIS — J8410 Pulmonary fibrosis, unspecified: Secondary | ICD-10-CM | POA: Diagnosis not present

## 2019-04-21 DIAGNOSIS — E039 Hypothyroidism, unspecified: Secondary | ICD-10-CM | POA: Diagnosis not present

## 2019-04-21 DIAGNOSIS — E7800 Pure hypercholesterolemia, unspecified: Secondary | ICD-10-CM | POA: Diagnosis not present

## 2019-04-21 DIAGNOSIS — M359 Systemic involvement of connective tissue, unspecified: Secondary | ICD-10-CM | POA: Diagnosis not present

## 2019-07-31 DIAGNOSIS — I10 Essential (primary) hypertension: Secondary | ICD-10-CM | POA: Diagnosis not present

## 2019-07-31 DIAGNOSIS — E039 Hypothyroidism, unspecified: Secondary | ICD-10-CM | POA: Diagnosis not present

## 2019-08-17 DIAGNOSIS — E039 Hypothyroidism, unspecified: Secondary | ICD-10-CM | POA: Diagnosis not present

## 2019-09-09 ENCOUNTER — Other Ambulatory Visit: Payer: Self-pay

## 2019-09-09 ENCOUNTER — Ambulatory Visit
Admission: RE | Admit: 2019-09-09 | Discharge: 2019-09-09 | Disposition: A | Discharge status: Home / Self Care | Payer: Medicare HMO | Source: Ambulatory Visit | Attending: Internal Medicine | Admitting: Internal Medicine

## 2019-09-09 DIAGNOSIS — R748 Abnormal levels of other serum enzymes: Secondary | ICD-10-CM | POA: Diagnosis not present

## 2019-09-09 DIAGNOSIS — Z1231 Encounter for screening mammogram for malignant neoplasm of breast: Secondary | ICD-10-CM

## 2019-09-09 DIAGNOSIS — M064 Inflammatory polyarthropathy: Secondary | ICD-10-CM | POA: Diagnosis not present

## 2019-09-09 DIAGNOSIS — I7300 Raynaud's syndrome without gangrene: Secondary | ICD-10-CM | POA: Diagnosis not present

## 2019-09-09 DIAGNOSIS — M359 Systemic involvement of connective tissue, unspecified: Secondary | ICD-10-CM | POA: Diagnosis not present

## 2019-09-09 DIAGNOSIS — J849 Interstitial pulmonary disease, unspecified: Secondary | ICD-10-CM | POA: Diagnosis not present

## 2019-09-09 DIAGNOSIS — M810 Age-related osteoporosis without current pathological fracture: Secondary | ICD-10-CM | POA: Diagnosis not present

## 2019-09-11 ENCOUNTER — Other Ambulatory Visit: Payer: Self-pay | Admitting: Internal Medicine

## 2019-09-11 DIAGNOSIS — R928 Other abnormal and inconclusive findings on diagnostic imaging of breast: Secondary | ICD-10-CM

## 2019-09-30 ENCOUNTER — Other Ambulatory Visit: Payer: Self-pay

## 2019-09-30 ENCOUNTER — Ambulatory Visit
Admission: RE | Admit: 2019-09-30 | Discharge: 2019-09-30 | Disposition: A | Discharge status: Home / Self Care | Payer: Medicare HMO | Source: Ambulatory Visit | Attending: Internal Medicine | Admitting: Internal Medicine

## 2019-09-30 DIAGNOSIS — R928 Other abnormal and inconclusive findings on diagnostic imaging of breast: Secondary | ICD-10-CM

## 2019-09-30 DIAGNOSIS — N6489 Other specified disorders of breast: Secondary | ICD-10-CM | POA: Diagnosis not present

## 2019-09-30 DIAGNOSIS — R922 Inconclusive mammogram: Secondary | ICD-10-CM | POA: Diagnosis not present

## 2019-09-30 DIAGNOSIS — E039 Hypothyroidism, unspecified: Secondary | ICD-10-CM | POA: Diagnosis not present

## 2019-10-21 DIAGNOSIS — H409 Unspecified glaucoma: Secondary | ICD-10-CM | POA: Diagnosis not present

## 2019-10-21 DIAGNOSIS — E7800 Pure hypercholesterolemia, unspecified: Secondary | ICD-10-CM | POA: Diagnosis not present

## 2019-10-21 DIAGNOSIS — M359 Systemic involvement of connective tissue, unspecified: Secondary | ICD-10-CM | POA: Diagnosis not present

## 2019-10-21 DIAGNOSIS — M810 Age-related osteoporosis without current pathological fracture: Secondary | ICD-10-CM | POA: Diagnosis not present

## 2019-10-21 DIAGNOSIS — E039 Hypothyroidism, unspecified: Secondary | ICD-10-CM | POA: Diagnosis not present

## 2019-10-21 DIAGNOSIS — I10 Essential (primary) hypertension: Secondary | ICD-10-CM | POA: Diagnosis not present

## 2019-11-20 ENCOUNTER — Other Ambulatory Visit (INDEPENDENT_AMBULATORY_CARE_PROVIDER_SITE_OTHER): Payer: Self-pay | Admitting: Internal Medicine

## 2019-11-20 DIAGNOSIS — K219 Gastro-esophageal reflux disease without esophagitis: Secondary | ICD-10-CM

## 2019-11-23 DIAGNOSIS — H409 Unspecified glaucoma: Secondary | ICD-10-CM | POA: Diagnosis not present

## 2019-11-23 DIAGNOSIS — Z131 Encounter for screening for diabetes mellitus: Secondary | ICD-10-CM | POA: Diagnosis not present

## 2019-11-23 DIAGNOSIS — Z1389 Encounter for screening for other disorder: Secondary | ICD-10-CM | POA: Diagnosis not present

## 2019-11-23 DIAGNOSIS — M359 Systemic involvement of connective tissue, unspecified: Secondary | ICD-10-CM | POA: Diagnosis not present

## 2019-11-23 DIAGNOSIS — E039 Hypothyroidism, unspecified: Secondary | ICD-10-CM | POA: Diagnosis not present

## 2019-11-23 DIAGNOSIS — R739 Hyperglycemia, unspecified: Secondary | ICD-10-CM | POA: Diagnosis not present

## 2019-11-23 DIAGNOSIS — J8410 Pulmonary fibrosis, unspecified: Secondary | ICD-10-CM | POA: Diagnosis not present

## 2019-11-23 DIAGNOSIS — Z0000 Encounter for general adult medical examination without abnormal findings: Secondary | ICD-10-CM | POA: Diagnosis not present

## 2019-11-23 DIAGNOSIS — Z7952 Long term (current) use of systemic steroids: Secondary | ICD-10-CM | POA: Diagnosis not present

## 2019-11-23 DIAGNOSIS — I10 Essential (primary) hypertension: Secondary | ICD-10-CM | POA: Diagnosis not present

## 2019-11-30 DIAGNOSIS — Z961 Presence of intraocular lens: Secondary | ICD-10-CM | POA: Diagnosis not present

## 2019-11-30 DIAGNOSIS — H401131 Primary open-angle glaucoma, bilateral, mild stage: Secondary | ICD-10-CM | POA: Diagnosis not present

## 2019-12-01 ENCOUNTER — Encounter (INDEPENDENT_AMBULATORY_CARE_PROVIDER_SITE_OTHER): Payer: Self-pay | Admitting: Internal Medicine

## 2019-12-01 ENCOUNTER — Ambulatory Visit (INDEPENDENT_AMBULATORY_CARE_PROVIDER_SITE_OTHER): Payer: Medicare HMO | Admitting: Internal Medicine

## 2019-12-01 ENCOUNTER — Other Ambulatory Visit: Payer: Self-pay

## 2019-12-01 DIAGNOSIS — K219 Gastro-esophageal reflux disease without esophagitis: Secondary | ICD-10-CM | POA: Diagnosis not present

## 2019-12-01 DIAGNOSIS — Z86010 Personal history of colonic polyps: Secondary | ICD-10-CM | POA: Insufficient documentation

## 2019-12-01 NOTE — Progress Notes (Signed)
Virtual Visit via Telephone Note  Patient had face-to-face office visit today.  Visit was changed to virtual telephone visit because of ongoing Covid-19 pandemic.  Patient does not have a set up for FaceTime.  Patient is agreeable to proceed with telephone visit. I connected with Michelle Bonilla on 12/01/19 at  2:51 PM EST by telephone and verified that I am speaking with the correct person using two identifiers.  Location: Patient: home Provider: office   I discussed the limitations, risks, security and privacy concerns of performing an evaluation and management service by telephone and the availability of in person appointments. I also discussed with the patient that there may be a patient responsible charge related to this service. The patient expressed understanding and agreed to proceed.   History of Present Illness:  Patient is 78 year old Afro-American female who has a history of erosive/ulcerative reflux esophagitis(EGD April 2012) who was last seen in the office in June 2018.  Since she was doing quite well with therapy I recommended changing PPI dose to every other day. Last colonoscopy was in August 2014 with removal of 2 small polyps and these were tubular adenomas.  Follow-up was recommended in 7 years.  She states she is doing well.  She rarely has heartburn.  She is watching her diet very closely.  She denies nausea vomiting dysphagia hoarseness sore throat chronic cough.  She also denies abdominal pain melena or rectal bleeding.  Her bowels move daily. She is not having any side effects with pantoprazole.   Current Outpatient Medications:  .  atenolol (TENORMIN) 25 MG tablet, TAKE 2 TABLET IN THE MORNING  AND ONE TABLET AT BEDTIME (Patient taking differently: TAKE 1 TABLET IN THE MORNING  AND ONE TABLET AT BEDTIME), Disp: 90 tablet, Rfl: 6 .  cholecalciferol (VITAMIN D3) 25 MCG (1000 UT) tablet, Take 1,000 Units by mouth daily., Disp: , Rfl:  .  cloNIDine (CATAPRES) 0.1 MG  tablet, Take 1 tablet (0.1 mg total) by mouth 2 (two) times daily. (Patient taking differently: Take 0.1 mg by mouth at bedtime. ), Disp: 60 tablet, Rfl: 5 .  gemfibrozil (LOPID) 600 MG tablet, Take 600 mg by mouth 2 (two) times daily. , Disp: , Rfl:  .  levothyroxine (SYNTHROID, LEVOTHROID) 75 MCG tablet, Take 50 mcg by mouth daily. , Disp: , Rfl:  .  pantoprazole (PROTONIX) 40 MG tablet, TAKE (1) TABLET BY MOUTH ONCE DAILY. (Patient taking differently: 40 mg every other day. ), Disp: 90 tablet, Rfl: 0 .  predniSONE (DELTASONE) 1 MG tablet, Take 1 mg by mouth. Pt takes 4 tablets altogether by mouth, Disp: , Rfl:    Observations/Objective:  Patient reported her weight to be 95 pounds.  Assessment and Plan:  #1.  History of erosive/ulcerative reflux esophagitis.  She is doing very well with antireflux measures and pantoprazole every other day.  Given history of connective tissue disorder/rheumatoid arthritis she may also have an element of motility disorder.  Therefore it would not be a good idea to stop her PPI.  I feel benefit outweighs the risk.  #2.  History of colonic adenomas.  Last colonoscopy was in August 2014 with removal of 2 small tubular adenomas.  Follow Up Instructions:  Patient will continue antireflux measures as before. She will continue pantoprazole at 40 mg by mouth every other day.  If she starts to have frequent breakthrough symptoms she will call office. Office visit in September 2021 at which time will consider scheduling for surveillance colonoscopy.  I discussed the assessment and treatment plan with the patient. The patient was provided an opportunity to ask questions and all were answered. The patient agreed with the plan and demonstrated an understanding of the instructions.   The patient was advised to call back or seek an in-person evaluation if the symptoms worsen or if the condition fails to improve as anticipated.  I provided 13 minutes of non-face-to-face  time during this encounter.   Hildred Laser, MD

## 2019-12-01 NOTE — Patient Instructions (Signed)
Patient will call if pantoprazole every other day does not work. We will plan colonoscopy after her next office visit in September 2021.

## 2020-01-04 DIAGNOSIS — E039 Hypothyroidism, unspecified: Secondary | ICD-10-CM | POA: Diagnosis not present

## 2020-01-14 DIAGNOSIS — Z961 Presence of intraocular lens: Secondary | ICD-10-CM | POA: Diagnosis not present

## 2020-01-14 DIAGNOSIS — H401111 Primary open-angle glaucoma, right eye, mild stage: Secondary | ICD-10-CM | POA: Diagnosis not present

## 2020-01-18 ENCOUNTER — Telehealth: Payer: Self-pay | Admitting: Internal Medicine

## 2020-01-18 NOTE — Telephone Encounter (Signed)
Spoke with pt. She would like MW's recommendations on a PCP in the [place] group. Pt prefers a female provider.  MW - please advise. Thanks.

## 2020-01-18 NOTE — Telephone Encounter (Signed)
Called and spoke with pt letting her know the info stated by MW. Pt verbalized understanding. I provided pt with the phone number for Dr. Julianne Rice office so she could contact them to see if she could be established with her. Nothing further needed.

## 2020-01-18 NOTE — Telephone Encounter (Signed)
I don't know them well - Michelle Bonilla is at [facility] and may be taking new pts

## 2020-01-28 DIAGNOSIS — H401131 Primary open-angle glaucoma, bilateral, mild stage: Secondary | ICD-10-CM | POA: Diagnosis not present

## 2020-02-19 DIAGNOSIS — E039 Hypothyroidism, unspecified: Secondary | ICD-10-CM | POA: Diagnosis not present

## 2020-02-25 DIAGNOSIS — H401131 Primary open-angle glaucoma, bilateral, mild stage: Secondary | ICD-10-CM | POA: Diagnosis not present

## 2020-02-25 DIAGNOSIS — Z961 Presence of intraocular lens: Secondary | ICD-10-CM | POA: Diagnosis not present

## 2020-02-25 DIAGNOSIS — H18413 Arcus senilis, bilateral: Secondary | ICD-10-CM | POA: Diagnosis not present

## 2020-03-10 DIAGNOSIS — I7300 Raynaud's syndrome without gangrene: Secondary | ICD-10-CM | POA: Diagnosis not present

## 2020-03-10 DIAGNOSIS — Z681 Body mass index (BMI) 19 or less, adult: Secondary | ICD-10-CM | POA: Diagnosis not present

## 2020-03-10 DIAGNOSIS — M064 Inflammatory polyarthropathy: Secondary | ICD-10-CM | POA: Diagnosis not present

## 2020-03-10 DIAGNOSIS — J849 Interstitial pulmonary disease, unspecified: Secondary | ICD-10-CM | POA: Diagnosis not present

## 2020-03-10 DIAGNOSIS — M359 Systemic involvement of connective tissue, unspecified: Secondary | ICD-10-CM | POA: Diagnosis not present

## 2020-04-07 ENCOUNTER — Other Ambulatory Visit (INDEPENDENT_AMBULATORY_CARE_PROVIDER_SITE_OTHER): Payer: Self-pay | Admitting: Internal Medicine

## 2020-04-07 DIAGNOSIS — K219 Gastro-esophageal reflux disease without esophagitis: Secondary | ICD-10-CM

## 2020-04-26 ENCOUNTER — Telehealth: Payer: Self-pay | Admitting: Family Medicine

## 2020-04-26 NOTE — Telephone Encounter (Signed)
Pt daughter [name], brought in information about pt that she would like Dr. Martinique to take a look at before pt new patient appt on Friday 4/30. It has been placed in Martinique folder up front.

## 2020-04-26 NOTE — Telephone Encounter (Signed)
Noted. Information given to Dr. Martinique for review.

## 2020-04-27 NOTE — Telephone Encounter (Signed)
I read documents. A few dx's need to be consider,including psychiatric disorder. She is going to need psychiatric evaluation as well.  Thanks, BJ

## 2020-04-29 ENCOUNTER — Other Ambulatory Visit: Payer: Self-pay

## 2020-04-29 ENCOUNTER — Encounter: Payer: Self-pay | Admitting: Family Medicine

## 2020-04-29 ENCOUNTER — Ambulatory Visit (INDEPENDENT_AMBULATORY_CARE_PROVIDER_SITE_OTHER): Payer: Medicare HMO | Admitting: Family Medicine

## 2020-04-29 VITALS — BP 122/70 | HR 66 | Temp 97.8°F | Resp 12 | Ht 61.0 in | Wt 88.0 lb

## 2020-04-29 DIAGNOSIS — E039 Hypothyroidism, unspecified: Secondary | ICD-10-CM

## 2020-04-29 DIAGNOSIS — R000 Tachycardia, unspecified: Secondary | ICD-10-CM | POA: Diagnosis not present

## 2020-04-29 DIAGNOSIS — N1832 Chronic kidney disease, stage 3b: Secondary | ICD-10-CM

## 2020-04-29 DIAGNOSIS — R251 Tremor, unspecified: Secondary | ICD-10-CM | POA: Diagnosis not present

## 2020-04-29 DIAGNOSIS — E785 Hyperlipidemia, unspecified: Secondary | ICD-10-CM

## 2020-04-29 NOTE — Progress Notes (Addendum)
HPI:  [name] is a 79 y.o. female, who is here today with her daughter to establish care.  Former PCP: Dr Delfina Redwood Last preventive routine visit: 01/2014. AWV 11/23/19  Chronic medical problems: GERD, hypothyroidism, hyperlipidemia, CKD III, connective tissue disease,anemia, and hypertension among some. She lives alone. Babysit her grandchildren.  She follows with rheumatologist regularly, Dr Trudie Reed. Glaucoma,she sees Dr Marisa Hua.  Hypothyroidism: She is on Levothyroxine 50 mcg daily. Last TSH 02/19/20 was 1.06.  Lab Results  Component Value Date   TSH 0.167 (L) 12/20/2018   She would like a referral to endocrinologist. Her daughter , sent me a list of concerns before today's visit to be address today.  HTN: She is on Atenolol 25 mg bid and Clonidine 0.1 mg at bedtime.  For the past 6 months she has been defensive, agitated at times. Talking about situations that did not happened. Accusing family members of planning on getting her"commited" , conspiratory theories. She tells me that I may see her on TV. The TV is talking to her.  She gets very upset when I tried to evaluate short term memory and worse when I recommended having blood work today.  Review of Systems  Constitutional: Negative for activity change, appetite change, fatigue and fever.  HENT: Negative for mouth sores, nosebleeds and sore throat.   Eyes: Negative for redness and visual disturbance.  Respiratory: Negative for cough, shortness of breath and wheezing.   Cardiovascular: Negative for chest pain, palpitations and leg swelling.  Gastrointestinal: Negative for abdominal pain, nausea and vomiting.       Negative for changes in bowel habits.  Genitourinary: Negative for decreased urine volume, dysuria and hematuria.  Neurological: Negative for syncope, weakness and headaches.  Psychiatric/Behavioral: Positive for behavioral problems. The patient is nervous/anxious.   Rest see pertinent  positives and negatives per HPI.  Current Outpatient Medications on File Prior to Visit  Medication Sig Dispense Refill  . atenolol (TENORMIN) 25 MG tablet TAKE 2 TABLET IN THE MORNING  AND ONE TABLET AT BEDTIME (Patient taking differently: TAKE 1 TABLET IN THE MORNING  AND ONE TABLET AT BEDTIME) 90 tablet 6  . BOOSTRIX 5-2.5-18.5 LF-MCG/0.5 injection     . cholecalciferol (VITAMIN D3) 25 MCG (1000 UT) tablet Take 1,000 Units by mouth daily.    . cloNIDine (CATAPRES) 0.1 MG tablet Take 1 tablet (0.1 mg total) by mouth 2 (two) times daily. (Patient taking differently: Take 0.1 mg by mouth at bedtime. ) 60 tablet 5  . FLUZONE HIGH-DOSE QUADRIVALENT 0.7 ML SUSY     . gemfibrozil (LOPID) 600 MG tablet Take 600 mg by mouth 2 (two) times daily.     [name] levothyroxine (SYNTHROID, LEVOTHROID) 75 MCG tablet Take 50 mcg by mouth daily.     . pantoprazole (PROTONIX) 40 MG tablet TAKE (1) TABLET BY MOUTH ONCE DAILY. 90 tablet 0  . predniSONE (DELTASONE) 5 MG tablet Take 5 mg by mouth daily.     No current facility-administered medications on file prior to visit.     Past Medical History:  Diagnosis Date  . CKD (chronic kidney disease)   . Connective tissue disease ([place])   . Hypertension   . Hypothyroid 03/06/2011   per patient  . Inflammatory arthritis   . PUD (peptic ulcer disease)    Dr Dereck Leep  . Pulmonary fibrosis (HCC)    Allergies  Allergen Reactions  . Xalatan [Latanoprost] Itching  . Lisinopril Cough    Family History  Problem Relation Age of Onset  . Breast cancer Sister   . Bone cancer Sister     Social History   Socioeconomic History  . Marital status: Single    Spouse name: Not on file  . Number of children: 1  . Years of education: Not on file  . Highest education level: Not on file  Occupational History  . 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  Tobacco Use  . Smoking status: Never Smoker  . Smokeless tobacco: Never  Used  Substance and Sexual Activity  . Alcohol use: No  . Drug use: No  . Sexual activity: Not Currently  Other Topics Concern  . 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   She is currently "single "with 1 daughter and 2 grandchildren.  Her daughter is with her today.   She walks pretty much 5 days a week for about an hour to 2 hours of the day.   Social Determinants of Health   Financial Resource Strain:   . Difficulty of Paying Living Expenses:   Food Insecurity:   . Worried About Charity fundraiser in the Last Year:   . Arboriculturist in the Last Year:   Transportation Needs:   . Film/video editor (Medical):   [name] Lack of Transportation (Non-Medical):   Physical Activity:   . Days of Exercise per Week:   . Minutes of Exercise per Session:   Stress:   . Feeling of Stress :   Social Connections:   . Frequency of Communication with Friends and Family:   . Frequency of Social Gatherings with Friends and Family:   . Attends Religious Services:   . Active Member of Clubs or Organizations:   . Attends Archivist Meetings:   [name] Marital Status:     Vitals:   04/29/20 0911  BP: 122/70  Pulse: 66  Resp: 12  Temp: 97.8 F (36.6 C)  SpO2: 100%    Body mass index is 16.63 kg/m.  Physical Exam  Nursing note and vitals reviewed. Constitutional: She is oriented to person, place, and time. She appears well-developed. No distress.  HENT:  Head: Normocephalic and atraumatic.  Mouth/Throat: Oropharynx is clear and moist and mucous membranes are normal.  Eyes: Pupils are equal, round, and reactive to light. Conjunctivae are normal.  Cardiovascular: Regular rhythm. Tachycardia present.  No murmur heard. Pulses:      Dorsalis pedis pulses are 2+ on the right side and 2+ on the left side.  HR: 120/min by my count.  Respiratory: Effort normal and breath sounds normal. No respiratory distress.    GI: Soft. She exhibits no mass. There is no hepatomegaly. There is no abdominal tenderness.  Musculoskeletal:        General: No edema.  Lymphadenopathy:    She has no cervical adenopathy.  Neurological: She is alert and oriented to person, place, and time. She has normal strength. She displays tremor. No cranial nerve deficit. Gait normal.  Skin: Skin is warm. No rash noted. No erythema.  Psychiatric: She has a normal mood and affect. She is agitated and combative. Thought content is delusional. She expresses no suicidal ideation.  Well groomed, good eye contact.   ASSESSMENT AND PLAN:  Ms. Shone was seen today for establish care.  Diagnoses and all orders for this  visit:  Stage 3b chronic kidney disease I do not have access to recent labs. She had lab work at the rheumatologist's office. Adequate hydration.  Hypothyroidism, unspecified type In the past TSH has been low. I am concerned about her taking more medication than prescribed. Refused labs.  Hyperlipidemia, unspecified hyperlipidemia type HyperTG, continue Lopid 600 mg bid and low fat diet.  Tremor Hands and head tremor. Possible causes discussed.  Sinus tachycardia Refused EKG and lab work.  Very upset,tried to leave 2 times before leaving ,her daughter persuaded her to stay longer. States that "I may not come back" She acuses daughter of giving me the wrong information,states that she is adopted and doe snot know her well.  I am concerned about possible hyperthyroidism due to excess Levothyroxine. She is also on chronic prednisone.  Psychotic behavior.  Mentions that TV is talking to her. 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  I will call her daughter later to discuss OV findings,my concerns,and recommendations.   Return in about 3 months (around 07/29/2020).   Wilder Kurowski G. Martinique, MD  [facility]. [place] office.

## 2020-04-29 NOTE — Patient Instructions (Addendum)
A few things to remember from today's visit:  I am concerned about your thyroid, elevated heart rate and tremor can be related. Be careful with thyroid medication.  If you can change your mind you can let me know, we can arrange blood work.   Please be sure medication list is accurate. If a new problem present, please set up appointment sooner than planned today.

## 2020-04-29 NOTE — Telephone Encounter (Signed)
Noted. Dr. Martinique discussed with patient and daughter at visit. Nothing further needed at this time.

## 2020-05-02 ENCOUNTER — Telehealth: Payer: Self-pay | Admitting: Family Medicine

## 2020-05-02 NOTE — Telephone Encounter (Signed)
Pt's daughter [name] stated that her  Mothers PCP said she would call. She forgot what about but wanted to make sure she calls the right phone number.   She also stated she has something's to discuss with her or CMA. She stated she sent in some concerns prior to the new pt appt and Dr. Martinique did address some of them but her mother become irritated so they had to leave. She has been texting her mother to check on her and her mother responded her that she may have taken too many of her thyroid medication. Misty stated it would take her time to respond and when she did she stated "not to come see her cause she doesn't want her to see her that way" Rojelio Brenner has gone over to her house to check on her but she would not let her in or unlock the door. She is not sure if she is telling the truth about her thyroid medication and would like advice on what she can do?   Misty can be reached at [phone]

## 2020-05-02 NOTE — Telephone Encounter (Signed)
Spoke with Michelle Bonilla about her mother's recent visit. In my opinion and based on examination during visit,she has psychotic like behavior. This problem has been going on for a few months, gradually getting worse. We discussed possible etiologies.  I am afraid she may be taking more Levothyroxine that recommended. She refused to have labs and EKG done here in the office. Her daughter,Michelle Bonilla, has tried to reach her today but she is not answering the door and not texting back.She is now driving to her house to check on her. At this time I do not think Michelle Bonilla is in the right state of mind to make decisions.  If she does not answer the door or if she does not agree with going to the ER, I recommend calling EMS. Michelle Bonilla voices understanding and agrees with plan. Ainsley Sanguinetti Martinique, MD

## 2020-05-02 NOTE — Telephone Encounter (Signed)
Spoke with [place] and she stated that she was waiting on a phone call from Dr. Martinique because she couldn't finish the visit on Friday due to patient seeming to be very agitated. Misty stated that patient has been acting agitated all weekend and will not let anyone come see her. She would like a call back from Dr. Martinique to discuss patient's behavior and what she should do.

## 2020-05-03 ENCOUNTER — Other Ambulatory Visit: Payer: Self-pay

## 2020-05-03 ENCOUNTER — Encounter (HOSPITAL_COMMUNITY): Payer: Self-pay | Admitting: Emergency Medicine

## 2020-05-03 ENCOUNTER — Emergency Department (HOSPITAL_COMMUNITY)
Admission: EM | Admit: 2020-05-03 | Discharge: 2020-05-06 | Disposition: A | Discharge status: Other Acute Inpatient Hospital | Payer: Medicare HMO | Attending: Emergency Medicine | Admitting: Emergency Medicine

## 2020-05-03 DIAGNOSIS — F0391 Unspecified dementia with behavioral disturbance: Secondary | ICD-10-CM | POA: Diagnosis not present

## 2020-05-03 DIAGNOSIS — I129 Hypertensive chronic kidney disease with stage 1 through stage 4 chronic kidney disease, or unspecified chronic kidney disease: Secondary | ICD-10-CM | POA: Insufficient documentation

## 2020-05-03 DIAGNOSIS — J439 Emphysema, unspecified: Secondary | ICD-10-CM | POA: Diagnosis not present

## 2020-05-03 DIAGNOSIS — N189 Chronic kidney disease, unspecified: Secondary | ICD-10-CM | POA: Insufficient documentation

## 2020-05-03 DIAGNOSIS — F22 Delusional disorders: Secondary | ICD-10-CM | POA: Diagnosis not present

## 2020-05-03 DIAGNOSIS — Z03818 Encounter for observation for suspected exposure to other biological agents ruled out: Secondary | ICD-10-CM | POA: Diagnosis not present

## 2020-05-03 DIAGNOSIS — Z046 Encounter for general psychiatric examination, requested by authority: Secondary | ICD-10-CM | POA: Diagnosis present

## 2020-05-03 DIAGNOSIS — E039 Hypothyroidism, unspecified: Secondary | ICD-10-CM | POA: Insufficient documentation

## 2020-05-03 DIAGNOSIS — I10 Essential (primary) hypertension: Secondary | ICD-10-CM | POA: Diagnosis not present

## 2020-05-03 DIAGNOSIS — Z20822 Contact with and (suspected) exposure to covid-19: Secondary | ICD-10-CM | POA: Insufficient documentation

## 2020-05-03 DIAGNOSIS — M7989 Other specified soft tissue disorders: Secondary | ICD-10-CM | POA: Diagnosis not present

## 2020-05-03 DIAGNOSIS — Z79899 Other long term (current) drug therapy: Secondary | ICD-10-CM | POA: Diagnosis not present

## 2020-05-03 DIAGNOSIS — F29 Unspecified psychosis not due to a substance or known physiological condition: Secondary | ICD-10-CM | POA: Diagnosis not present

## 2020-05-03 DIAGNOSIS — R442 Other hallucinations: Secondary | ICD-10-CM | POA: Diagnosis not present

## 2020-05-03 LAB — COMPREHENSIVE METABOLIC PANEL
ALT: 14 U/L (ref 0–44)
AST: 25 U/L (ref 15–41)
Albumin: 3.8 g/dL (ref 3.5–5.0)
Alkaline Phosphatase: 60 U/L (ref 38–126)
Anion gap: 14 (ref 5–15)
BUN: 24 mg/dL — ABNORMAL HIGH (ref 8–23)
CO2: 20 mmol/L — ABNORMAL LOW (ref 22–32)
Calcium: 10.1 mg/dL (ref 8.9–10.3)
Chloride: 103 mmol/L (ref 98–111)
Creatinine, Ser: 1.49 mg/dL — ABNORMAL HIGH (ref 0.44–1.00)
GFR calc Af Amer: 39 mL/min — ABNORMAL LOW (ref >60)
GFR calc non Af Amer: 34 mL/min — ABNORMAL LOW (ref >60)
Glucose, Bld: 105 mg/dL — ABNORMAL HIGH (ref 70–99)
Potassium: 4.3 mmol/L (ref 3.5–5.1)
Sodium: 137 mmol/L (ref 135–145)
Total Bilirubin: 0.7 mg/dL (ref 0.3–1.2)
Total Protein: 8.6 g/dL — ABNORMAL HIGH (ref 6.5–8.1)

## 2020-05-03 LAB — URINALYSIS, ROUTINE W REFLEX MICROSCOPIC
Bacteria, UA: NONE SEEN
Bilirubin Urine: NEGATIVE
Glucose, UA: NEGATIVE mg/dL
Hgb urine dipstick: NEGATIVE
Ketones, ur: NEGATIVE mg/dL
Leukocytes,Ua: NEGATIVE
Nitrite: NEGATIVE
Protein, ur: 30 mg/dL — AB
Specific Gravity, Urine: 1.012 (ref 1.005–1.030)
pH: 5 (ref 5.0–8.0)

## 2020-05-03 LAB — RESPIRATORY PANEL BY RT PCR (FLU A&B, COVID)
Influenza A by PCR: NEGATIVE
Influenza B by PCR: NEGATIVE
SARS Coronavirus 2 by RT PCR: NEGATIVE

## 2020-05-03 LAB — RAPID URINE DRUG SCREEN, HOSP PERFORMED
Amphetamines: NOT DETECTED
Barbiturates: NOT DETECTED
Benzodiazepines: NOT DETECTED
Cocaine: NOT DETECTED
Opiates: NOT DETECTED
Tetrahydrocannabinol: NOT DETECTED

## 2020-05-03 LAB — CBC
HCT: 39.5 % (ref 36.0–46.0)
Hemoglobin: 12.6 g/dL (ref 12.0–15.0)
MCH: 25.7 pg — ABNORMAL LOW (ref 26.0–34.0)
MCHC: 31.9 g/dL (ref 30.0–36.0)
MCV: 80.4 fL (ref 80.0–100.0)
Platelets: 500 10*3/uL — ABNORMAL HIGH (ref 150–400)
RBC: 4.91 MIL/uL (ref 3.87–5.11)
RDW: 15.9 % — ABNORMAL HIGH (ref 11.5–15.5)
WBC: 9.5 10*3/uL (ref 4.0–10.5)
nRBC: 0 % (ref 0.0–0.2)

## 2020-05-03 LAB — FOLATE: Folate: 5.5 ng/mL — ABNORMAL LOW (ref >5.9)

## 2020-05-03 LAB — T4, FREE: Free T4: 0.97 ng/dL (ref 0.61–1.12)

## 2020-05-03 LAB — ETHANOL: Alcohol, Ethyl (B): <10 mg/dL (ref <10)

## 2020-05-03 LAB — VITAMIN B12: Vitamin B-12: 533 pg/mL (ref 180–914)

## 2020-05-03 LAB — TSH: TSH: 1.252 u[IU]/mL (ref 0.350–4.500)

## 2020-05-03 LAB — SEDIMENTATION RATE: Sed Rate: 47 mm/hr — ABNORMAL HIGH (ref 0–22)

## 2020-05-03 NOTE — Progress Notes (Signed)
Patient meets inpatient criteria per Earleen Newport, NP. Patient has been faxed out to the following facilities for review:   [facility]  [facility]  TTS will continue to follow and secure placement.   Domenic Schwab, MSW, LCSW-A Clinical Disposition Social Worker [facility]/TTS [phone]

## 2020-05-03 NOTE — ED Triage Notes (Signed)
PT brought in by [facility] for delusions such as (her daughter is a clone) by will answer A&O questions appropriately. PT paranoid that people are messing with her lights/water/power. PT daughter is at the magistrate office out IVC papers now bc she was refusing EMS and [facility] department was called to her house to make sure pt is brought to the ED for eval. Daughter reports that the patient has had delusions for a long time but for the past 4 days has become worse.

## 2020-05-03 NOTE — ED Provider Notes (Signed)
[facility] EMERGENCY DEPARTMENT Provider Note   CSN: [id number] Arrival date & time: 05/03/20  1415     History Chief Complaint  Patient presents with  . Paranoid    Michelle Bonilla is a 79 y.o. female.  Patient with hx CRI, htn, presents via ems, for mental health evaluation. Pt very limited historian - level 5 caveat, ?dementia w psychosis. Per EMS/daughter, pt with paranoid delusions, thinking her daughter is a clone that is out to get her, thinking unnamed others are trying to harm her, and disrupt her power/water, hearing voices, responding to internal stimuli. Symptoms began months ago, but worse in past week. No report of trauma/fall. No fevers.   The history is provided by the patient and the EMS personnel. The history is limited by the condition of the patient.       Past Medical History:  Diagnosis Date  . CKD (chronic kidney disease)   . Connective tissue disease ([place])   . Hypertension   . Hypothyroid 03/06/2011   per patient  . Inflammatory arthritis   . PUD (peptic ulcer disease)    Dr Dereck Leep  . Pulmonary fibrosis [facility])     Patient Active Problem List   Diagnosis Date Noted  . GERD (gastroesophageal reflux disease) 12/01/2019  . History of colonic polyps 12/01/2019  . Abnormal EKG 01/07/2019  . Rheumatoid arthritis ([place]) 12/21/2018  . CKD (chronic kidney disease)   . RA (refractory anemia) (HCC)   . Syncope and collapse 12/20/2018  . Hypothyroidism 12/20/2018  . Hyperlipidemia 12/20/2018  . Pain in joint, shoulder region 08/31/2014  . Decreased range of motion of left shoulder 08/31/2014  . Muscle weakness (generalized) 08/31/2014  . Fracture of proximal end of left humerus 08/13/2014  . Essential hypertension 04/02/2012  . Pulmonary fibrosis assoc with RA 05/24/2011    Past Surgical History:  Procedure Laterality Date  . Colonoscopy    . COLONOSCOPY N/A 10/07/2013   Procedure: COLONOSCOPY;  Surgeon: Rogene Houston, MD;  Location: [facility] SUITE;   Service: Endoscopy;  Laterality: N/A;  1200  . Esophagogstroduodenoscopy    . TRANSTHORACIC ECHOCARDIOGRAM  12/2018   Echo: EF 55-60%. ~ Veto [place] DD. Septal dyssynergy 2/2 BBB. Otherwise normal .     OB History   No obstetric history on file.     Family History  Problem Relation Age of Onset  . Breast cancer Sister   . Bone cancer Sister     Social History   Tobacco Use  . Smoking status: Never Smoker  . Smokeless tobacco: Never Used  Substance Use Topics  . Alcohol use: No  . Drug use: No    Home Medications Prior to Admission medications   Medication Sig Start Date End Date Taking? Authorizing Provider  atenolol (TENORMIN) 25 MG tablet TAKE 2 TABLET IN THE MORNING  AND ONE TABLET AT BEDTIME Patient taking differently: TAKE 1 TABLET IN THE MORNING  AND ONE TABLET AT BEDTIME 01/07/19   Leonie Man, MD  [place] 5-2.5-18.5 LF-MCG/0.5 injection  11/23/19   [provider]  cholecalciferol (VITAMIN D3) 25 MCG (1000 UT) tablet Take 1,000 Units by mouth daily.    [provider]  cloNIDine (CATAPRES) 0.1 MG tablet Take 1 tablet (0.1 mg total) by mouth 2 (two) times daily. Patient taking differently: Take 0.1 mg by mouth at bedtime.  01/16/19   Leonie Man, MD  FLUZONE HIGH-DOSE QUADRIVALENT 0.7 ML SUSY  11/13/19   [provider]  gemfibrozil (LOPID)  600 MG tablet Take 600 mg by mouth 2 (two) times daily.  01/14/13   [provider]  levothyroxine (SYNTHROID, LEVOTHROID) 75 MCG tablet Take 50 mcg by mouth daily.     [provider]  pantoprazole (PROTONIX) 40 MG tablet TAKE (1) TABLET BY MOUTH ONCE DAILY. 04/07/20   Rehman, Mechele Dawley, MD  predniSONE (DELTASONE) 5 MG tablet Take 5 mg by mouth daily. 03/23/20   [provider]    Allergies    Xalatan [latanoprost] and Lisinopril  Review of Systems   Review of Systems  Unable to perform ROS: Dementia  level 5 caveat  - dementia/psychosis    Physical Exam Updated Vital  Signs BP (!) 153/96 (BP Location: Left Arm)   Pulse (!) 106   Temp 98.6 F (37 C) (Oral)   Resp (!) 34   SpO2 100%   Physical Exam Vitals and nursing note reviewed.  Constitutional:      Appearance: She is well-developed.     Comments: Thin, frail appearing  HENT:     Head: Atraumatic.     Nose: Nose normal.     Mouth/Throat:     Mouth: Mucous membranes are moist.  Eyes:     General: No scleral icterus.    Conjunctiva/sclera: Conjunctivae normal.     Pupils: Pupils are equal, round, and reactive to light.  Neck:     Vascular: No carotid bruit.     Trachea: No tracheal deviation.     Comments: No stiffness or rigidity. Thyroid not grossly enlarged to tender. Trachea midline. No bruit.  Cardiovascular:     Rate and Rhythm: Normal rate and regular rhythm.     Pulses: Normal pulses.     Heart sounds: Normal heart sounds. No murmur. No friction rub. No gallop.   Pulmonary:     Effort: Pulmonary effort is normal. No respiratory distress.     Breath sounds: Normal breath sounds.  Abdominal:     General: Bowel sounds are normal. There is no distension.     Palpations: Abdomen is soft. There is no mass.     Tenderness: There is no abdominal tenderness. There is no guarding.  Genitourinary:    Comments: No cva tenderness.  Musculoskeletal:        General: No swelling or tenderness.     Cervical back: Normal range of motion and neck supple. No rigidity. No muscular tenderness.  Lymphadenopathy:     Cervical: No cervical adenopathy.  Skin:    General: Skin is warm and dry.     Findings: No rash.  Neurological:     Mental Status: She is alert.     Comments: Alert, speech normal/fluent. Motor intact bil, stre 5/5. sens grossly intact.   Psychiatric:     Comments: Labile mood. Periods of agitation. Delusional thoughts.      ED Results / Procedures / Treatments   Labs (all labs ordered are listed, but only abnormal results are displayed) Results for orders placed or  performed during the hospital encounter of 05/03/20  CBC  Result Value Ref Range   WBC 9.5 4.0 - 10.5 K/uL   RBC 4.91 3.87 - 5.11 MIL/uL   Hemoglobin 12.6 12.0 - 15.0 g/dL   HCT 39.5 36.0 - 46.0 %   MCV 80.4 80.0 - 100.0 fL   MCH 25.7 (L) 26.0 - 34.0 pg   MCHC 31.9 30.0 - 36.0 g/dL   RDW 15.9 (H) 11.5 - 15.5 %   Platelets 500 (H)  150 - 400 K/uL   nRBC 0.0 0.0 - 0.2 %   EKG None  Radiology No results found.  Procedures Procedures (including critical care time)  Medications Ordered in ED Medications - No data to display  ED Course  I have reviewed the triage vital signs and the nursing notes.  Pertinent labs & imaging results that were available during my care of the patient were reviewed by me and considered in my medical decision making (see chart for details).    MDM Rules/Calculators/A&P                     Labs sent.   [place] team consulted.   Reviewed nursing notes and prior charts for additional history.   Initial labs reviewed/interpreted by me - wbc normal. Additional labs pending.   1530 labs pending - signed out to Dr Karle Starch to check labs, f/u with [facility] eval, and likely will need medical, or more likely geropsych admission.        Final Clinical Impression(s) / ED Diagnoses Final diagnoses:  None    Rx / DC Orders ED Discharge Orders    None       Lajean Saver, MD 05/03/20 1531

## 2020-05-03 NOTE — BH Assessment (Signed)
Tele Assessment Note   Patient Name: Michelle Bonilla MRN: [id number] Referring Physician: Ashok Cordia Location of Patient: [facility] Location of Provider: Behavioral Health TTS Department  Michelle Bonilla is an 79 y.o. female who was brought to the [facility] by her daughter and son-in-law due to an altered mental status and delusional thinking.  Patient states that her son-in-law wants her out of his life and if she does not go, she fears that he will kill her daughter and her grandchildren.  Patient states that she is willing to do what it takes to keep her children safe.  She states that he comes in and out of her house whenever he wants to and she states that he wants her gone.  Patient states, "I know all this is confusing."  Patient was highly disorganized.  However, she did know that she came to a [facility] facility for an evaluation, she knew the year and the current president.  However, patient was tangential when being asked questions and she did not answer all questions appropriately and kept referring back to her son-in-law.  Therefore, TTS contacted patient's daughter, Michelle Bonilla, [phone], who stated that she first noticed changes in her mother's mental status in November of last year.  She states that her mother became delusional.  Daughter states that she also does not think that patient is taking medications correctly.  She states that patient thinks that there are cameras in her house that her neighbors put in there because they are manufacturing methamphetamine in their home and she thinks they are watching her.  Daughter states that she scheduled an appointment with her PCP to have her evaluated last week and to get a referral to determine if her mother had dementia, but she states that patient was uncooperative and would not let her PCP draw blood and she would not participate in the cognitive tests.    Daughter states that patient has no history of mental illness and states that she has never  been suicidal/homicidal or psychotic prior to the past six months.  Daughter states that patient has no history of any drug or alcohol use.  Patient has no abuse history or history of self mutilation.  Daughter states that her mother worked all of her life until her retirement and she never married and currently lives alone.  Daughter does not think that patient is able to live on her own anymore and feels like she may need to be placed in a SNF unless her cognition improves.  Diagnosis: F03.91 Dementia  Past Medical History:  Past Medical History:  Diagnosis Date  . CKD (chronic kidney disease)   . Connective tissue disease ([place])   . Hypertension   . Hypothyroid 03/06/2011   per patient  . Inflammatory arthritis   . PUD (peptic ulcer disease)    Dr Dereck Leep  . Pulmonary fibrosis ([place])     Past Surgical History:  Procedure Laterality Date  . Colonoscopy    . COLONOSCOPY N/A 10/07/2013   Procedure: COLONOSCOPY;  Surgeon: Rogene Houston, MD;  Location: [facility];  Service: Endoscopy;  Laterality: N/A;  1200  . Esophagogstroduodenoscopy    . TRANSTHORACIC ECHOCARDIOGRAM  12/2018   Echo: EF 55-60%. ~ Veto Kemps DD. Septal dyssynergy 2/2 BBB. Otherwise normal .    Family History:  Family History  Problem Relation Age of Onset  . Breast cancer Sister   . Bone cancer Sister     Social History:  reports that she has never smoked.  She has never used smokeless tobacco. She reports that she does not drink alcohol or use drugs.  Additional Social History:  Alcohol / Drug Use Pain Medications: see MAR Prescriptions: see MAR Over the Counter: see MAR History of alcohol / drug use?: No history of alcohol / drug abuse Longest period of sobriety (when/how long): N/A  CIWA: CIWA-Ar BP: (!) 142/82 Pulse Rate: (!) 109 COWS:    Allergies:  Allergies  Allergen Reactions  . Xalatan [Latanoprost] Itching  . Lisinopril Cough    Home Medications: (Not in a hospital admission)   OB/GYN  Status:  No LMP recorded. Patient has had a hysterectomy.  General Assessment Data Location of Assessment: [facility] TTS Assessment: In system Is this a Tele or Face-to-Face Assessment?: Tele Assessment Is this an Initial Assessment or a Re-assessment for this encounter?: Initial Assessment Patient Accompanied by:: N/A Language Other than English: No Living Arrangements: Other (Comment)(lives alone) What gender do you identify as?: Female Marital status: Single Maiden name: Guggisberg Pregnancy Status: No Living Arrangements: Alone Can pt return to current living arrangement?: No Admission Status: Voluntary Is patient capable of signing voluntary admission?: Yes Referral Source: Self/Family/Friend Insurance type: Personnel officer Medicare)     Crisis Care Plan Living Arrangements: Alone Legal Guardian: Other:(self) Name of Psychiatrist: none Name of Therapist: none  Education Status Is patient currently in school?: No Is the patient employed, unemployed or receiving disability?: Receiving disability income(retired)  Risk to self with the past 6 months Suicidal Ideation: No Has patient been a risk to self within the past 6 months prior to admission? : No Suicidal Intent: No Has patient had any suicidal intent within the past 6 months prior to admission? : No Is patient at risk for suicide?: No Suicidal Plan?: No Has patient had any suicidal plan within the past 6 months prior to admission? : No Access to Means: No What has been your use of drugs/alcohol within the last 12 months?: none Previous Attempts/Gestures: No How many times?: (0) Other Self Harm Risks: (none) Triggers for Past Attempts: None known Intentional Self Injurious Behavior: None Family Suicide History: No Recent stressful life event(s): Conflict (Comment)(turmoil with family) Persecutory voices/beliefs?: Yes Depression: No Substance abuse history and/or treatment for substance abuse?: No Suicide prevention  information given to non-admitted patients: Not applicable  Risk to Others within the past 6 months Homicidal Ideation: No Does patient have any lifetime risk of violence toward others beyond the six months prior to admission? : No Thoughts of Harm to Others: No Current Homicidal Intent: No Current Homicidal Plan: No Access to Homicidal Means: No Identified Victim: none History of harm to others?: No Assessment of Violence: None Noted Violent Behavior Description: none Does patient have access to weapons?: No Criminal Charges Pending?: No Does patient have a court date: No Is patient on probation?: No  Psychosis Hallucinations: None noted Delusions: Persecutory(paranoid)  Mental Status Report Appearance/Hygiene: Unremarkable Eye Contact: Good Motor Activity: Freedom of movement Speech: Logical/coherent Level of Consciousness: Alert Mood: Pleasant Affect: Appropriate to circumstance Anxiety Level: Minimal Thought Processes: Flight of Ideas Judgement: Impaired Orientation: Person, Place, Time, Situation Obsessive Compulsive Thoughts/Behaviors: None  Cognitive Functioning Concentration: Decreased Memory: Recent Impaired, Remote Impaired Is patient IDD: No Insight: Poor Impulse Control: Poor Appetite: Fair Have you had any weight changes? : (unknown) Sleep: Decreased Total Hours of Sleep: (UTA) Vegetative Symptoms: None  ADLScreening [facility] Assessment Services) Patient's cognitive ability adequate to safely complete daily activities?: Yes Patient able to express need for assistance  with ADLs?: Yes Independently performs ADLs?: Yes (appropriate for developmental age)  Prior Inpatient Therapy Prior Inpatient Therapy: No  Prior Outpatient Therapy Prior Outpatient Therapy: No Does patient have an ACCT team?: No Does patient have Intensive In-House Services?  : No Does patient have [facility] services? : No Does patient have P4CC services?: No  ADL Screening  (condition at time of admission) Patient's cognitive ability adequate to safely complete daily activities?: Yes Is the patient deaf or have difficulty hearing?: No Does the patient have difficulty seeing, even when wearing glasses/contacts?: No Does the patient have difficulty concentrating, remembering, or making decisions?: No Patient able to express need for assistance with ADLs?: Yes Does the patient have difficulty dressing or bathing?: No Independently performs ADLs?: Yes (appropriate for developmental age) Does the patient have difficulty walking or climbing stairs?: No Weakness of Legs: None Weakness of Arms/Hands: None  Home Assistive Devices/Equipment Home Assistive Devices/Equipment: None  Therapy Consults (therapy consults require a physician order) PT Evaluation Needed: No OT Evalulation Needed: No SLP Evaluation Needed: No Abuse/Neglect Assessment (Assessment to be complete while patient is alone) Abuse/Neglect Assessment Can Be Completed: Yes Physical Abuse: Denies Verbal Abuse: Denies Sexual Abuse: Denies Exploitation of patient/patient's resources: Denies Self-Neglect: Denies Values / Beliefs Cultural Requests During Hospitalization: None Spiritual Requests During Hospitalization: None Consults Spiritual Care Consult Needed: No Transition of Care Team Consult Needed: No Advance Directives (For Healthcare) Does Patient Have a Medical Advance Directive?: No Nutrition Screen- [facility] Adult/[facility] Has the patient recently lost weight without trying?: No Has the patient been eating poorly because of a decreased appetite?: No Malnutrition Screening Tool Score: 0        Disposition: Per Shuvon Rankin, NP, Inpatient Geriatric Placement is recommended Disposition Initial Assessment Completed for this Encounter: Yes  This service was provided via telemedicine using a 2-way, interactive audio and video technology.  Names of all persons participating in this  telemedicine service and their role in this encounter. Name: Michelle Bonilla Role: patient  Name: Michelle Bonilla Role: daughter  Name: Levent Kornegay Role: TTS  Name:  Role:     Reatha Armour 05/03/2020 6:09 PM

## 2020-05-04 ENCOUNTER — Emergency Department (HOSPITAL_COMMUNITY): Payer: Medicare HMO

## 2020-05-04 DIAGNOSIS — M7989 Other specified soft tissue disorders: Secondary | ICD-10-CM | POA: Diagnosis not present

## 2020-05-04 DIAGNOSIS — J439 Emphysema, unspecified: Secondary | ICD-10-CM | POA: Diagnosis not present

## 2020-05-04 LAB — RPR: RPR Ser Ql: NONREACTIVE

## 2020-05-04 MED ORDER — PANTOPRAZOLE SODIUM 40 MG PO TBEC
40.0000 mg | DELAYED_RELEASE_TABLET | ORAL | Status: DC
  Administered 2020-05-04: 40 mg via ORAL
  Filled 2020-05-04 (×2): qty 1

## 2020-05-04 MED ORDER — PREDNISONE 10 MG PO TABS
5.0000 mg | ORAL_TABLET | Freq: Every day | ORAL | Status: DC
  Administered 2020-05-04 – 2020-05-05 (×2): 5 mg via ORAL
  Filled 2020-05-04 (×2): qty 1

## 2020-05-04 MED ORDER — GEMFIBROZIL 600 MG PO TABS
600.0000 mg | ORAL_TABLET | Freq: Two times a day (BID) | ORAL | Status: DC
  Administered 2020-05-04 – 2020-05-05 (×4): 600 mg via ORAL
  Filled 2020-05-04 (×11): qty 1

## 2020-05-04 MED ORDER — CLONIDINE HCL 0.1 MG PO TABS
0.1000 mg | ORAL_TABLET | Freq: Every day | ORAL | Status: DC
  Administered 2020-05-05: 0.1 mg via ORAL
  Filled 2020-05-04 (×3): qty 1

## 2020-05-04 MED ORDER — ATENOLOL 25 MG PO TABS
25.0000 mg | ORAL_TABLET | Freq: Two times a day (BID) | ORAL | Status: DC
  Administered 2020-05-04 – 2020-05-05 (×4): 25 mg via ORAL
  Filled 2020-05-04 (×4): qty 1

## 2020-05-04 MED ORDER — LEVOTHYROXINE SODIUM 50 MCG PO TABS
75.0000 ug | ORAL_TABLET | Freq: Every day | ORAL | Status: DC
  Administered 2020-05-04: 50 ug via ORAL
  Administered 2020-05-05 – 2020-05-06 (×2): 75 ug via ORAL
  Filled 2020-05-04 (×4): qty 2

## 2020-05-04 NOTE — Progress Notes (Signed)
CSW received a phone call from [place] at [place]. They requested a chest x-ray, EKG, and updated vitals. RN faxed POA paperwork to [facility].   CSW will follow up Thursday for bed availability.   Domenic Schwab, MSW, LCSW-A Clinical Disposition Social Worker [facility]/TTS [phone]

## 2020-05-04 NOTE — ED Notes (Signed)
Room broke down for psych pt IVC

## 2020-05-04 NOTE — ED Notes (Signed)
Pt informed that he daughter is here to visit her.  Pt refuses to have a visit. Pt also notifies me that she needs her daily meds.  EDP notified to order med and notified him that pt is hypertensive.

## 2020-05-04 NOTE — ED Provider Notes (Signed)
  Physical Exam  BP (!) 142/82   Pulse (!) 109   Temp 98.6 F (37 C) (Oral)   Resp (!) 32   SpO2 100%   Physical Exam  ED Course/Procedures     Procedures  MDM  Rounded on patient.  Labs reviewed from yesterday.  Overall reassuring.  Still pending Geri psych placement after TTS evaluation.       Davonna Belling, MD 05/04/20 [phone]

## 2020-05-04 NOTE — ED Notes (Signed)
Call to pharmacy to get them to deliver Lopid

## 2020-05-04 NOTE — ED Notes (Signed)
Faxed POA paperwork to [facility].  Notified EDP that prednisone has not been ordered yet

## 2020-05-04 NOTE — ED Notes (Signed)
Pt given meal tray.

## 2020-05-04 NOTE — ED Notes (Signed)
Brought pt daily meds which are now ordered.  Pt was upset about them all being brought at once.  She advised that she would take 15mcg of Levothyroxine and then take the atenolol in one hour.  Attempted to discuss her BP being elevated but pt is adamant. Will bring her the atenolol at 13:30 and check with her.

## 2020-05-04 NOTE — ED Notes (Signed)
Pt left hand is slightly purple, especially the left index and ring finger.  Finger is cool to touch and pt appears in no discomfort and denies any injury.  Discussed this with EDP.  Order for left hand x-ray.

## 2020-05-04 NOTE — ED Notes (Signed)
Offered pt to shower and new scrubs and for me to change her sheets.  Pt states that she does not shower but would like to wash up later.

## 2020-05-04 NOTE — ED Notes (Signed)
Pt's daughter called to check on pt; states she will be here at 12 to visit

## 2020-05-04 NOTE — ED Notes (Signed)
Patient given night time medications and given tooth brush/tooth paste and items for bathing. Per patient request.

## 2020-05-04 NOTE — ED Notes (Signed)
Patient given blue disposable scrub pants because purple/burgandy pants with elastic trim is causing patient to break out in a rash per patient complaint.. Patient still has purple/burgandy top.

## 2020-05-05 NOTE — Progress Notes (Signed)
CSW faxed MAR, vitals, and UDS results to [facility], at their request.   Audree Camel, [place], [place] Disposition CSW [facility]/TTS [phone]

## 2020-05-05 NOTE — ED Notes (Signed)
Went in pt's room to recheck BP, pt refused.  Pt stated I could check her BP again tonight.

## 2020-05-05 NOTE — Progress Notes (Signed)
Pt accepted to [facility]; [facility], room 420-01.     Dr. Alonna Minium is the accepting and attending physician.     Call report to [phone]  [facility] notified.     Pt is Voluntary.    Pt may be transported by [place].   Pt scheduled to arrive on 05/06/2020 anytime after 7:00am.   Domenic Schwab, MSW, [place] Disposition Social Worker [facility]/TTS [phone]

## 2020-05-05 NOTE — ED Notes (Signed)
Attempted to give clonidine to patient but pt refuses to take at this time.  Pt states she will take at night and that she did not receive atenolol or clonidine last night.  Explained to pt that Boykin Nearing is concerned about pt's elevated BP.

## 2020-05-06 DIAGNOSIS — R413 Other amnesia: Secondary | ICD-10-CM | POA: Diagnosis not present

## 2020-05-06 DIAGNOSIS — D473 Essential (hemorrhagic) thrombocythemia: Secondary | ICD-10-CM | POA: Diagnosis not present

## 2020-05-06 DIAGNOSIS — N179 Acute kidney failure, unspecified: Secondary | ICD-10-CM | POA: Diagnosis not present

## 2020-05-06 DIAGNOSIS — E860 Dehydration: Secondary | ICD-10-CM | POA: Diagnosis not present

## 2020-05-06 DIAGNOSIS — F29 Unspecified psychosis not due to a substance or known physiological condition: Secondary | ICD-10-CM | POA: Diagnosis not present

## 2020-05-06 DIAGNOSIS — R451 Restlessness and agitation: Secondary | ICD-10-CM | POA: Diagnosis not present

## 2020-05-06 DIAGNOSIS — E039 Hypothyroidism, unspecified: Secondary | ICD-10-CM | POA: Diagnosis not present

## 2020-05-06 DIAGNOSIS — E869 Volume depletion, unspecified: Secondary | ICD-10-CM | POA: Diagnosis not present

## 2020-05-06 DIAGNOSIS — I7300 Raynaud's syndrome without gangrene: Secondary | ICD-10-CM | POA: Diagnosis not present

## 2020-05-06 DIAGNOSIS — E43 Unspecified severe protein-calorie malnutrition: Secondary | ICD-10-CM | POA: Diagnosis not present

## 2020-05-06 DIAGNOSIS — Z20822 Contact with and (suspected) exposure to covid-19: Secondary | ICD-10-CM | POA: Diagnosis not present

## 2020-05-06 DIAGNOSIS — G4700 Insomnia, unspecified: Secondary | ICD-10-CM | POA: Diagnosis not present

## 2020-05-06 DIAGNOSIS — F419 Anxiety disorder, unspecified: Secondary | ICD-10-CM | POA: Diagnosis not present

## 2020-05-06 DIAGNOSIS — D72828 Other elevated white blood cell count: Secondary | ICD-10-CM | POA: Diagnosis not present

## 2020-05-06 DIAGNOSIS — J8410 Pulmonary fibrosis, unspecified: Secondary | ICD-10-CM | POA: Diagnosis not present

## 2020-05-06 DIAGNOSIS — F0391 Unspecified dementia with behavioral disturbance: Secondary | ICD-10-CM | POA: Diagnosis not present

## 2020-05-06 DIAGNOSIS — E785 Hyperlipidemia, unspecified: Secondary | ICD-10-CM | POA: Diagnosis not present

## 2020-05-06 DIAGNOSIS — Z681 Body mass index (BMI) 19 or less, adult: Secondary | ICD-10-CM | POA: Diagnosis not present

## 2020-05-06 DIAGNOSIS — I129 Hypertensive chronic kidney disease with stage 1 through stage 4 chronic kidney disease, or unspecified chronic kidney disease: Secondary | ICD-10-CM | POA: Diagnosis not present

## 2020-05-06 DIAGNOSIS — M069 Rheumatoid arthritis, unspecified: Secondary | ICD-10-CM | POA: Diagnosis not present

## 2020-05-06 DIAGNOSIS — E870 Hyperosmolality and hypernatremia: Secondary | ICD-10-CM | POA: Diagnosis not present

## 2020-05-06 DIAGNOSIS — Z515 Encounter for palliative care: Secondary | ICD-10-CM | POA: Diagnosis not present

## 2020-05-06 DIAGNOSIS — G9340 Encephalopathy, unspecified: Secondary | ICD-10-CM | POA: Diagnosis not present

## 2020-05-06 DIAGNOSIS — Z79899 Other long term (current) drug therapy: Secondary | ICD-10-CM | POA: Diagnosis not present

## 2020-05-06 DIAGNOSIS — R627 Adult failure to thrive: Secondary | ICD-10-CM | POA: Diagnosis not present

## 2020-05-06 DIAGNOSIS — K279 Peptic ulcer, site unspecified, unspecified as acute or chronic, without hemorrhage or perforation: Secondary | ICD-10-CM | POA: Diagnosis not present

## 2020-05-06 DIAGNOSIS — N189 Chronic kidney disease, unspecified: Secondary | ICD-10-CM | POA: Diagnosis not present

## 2020-05-06 DIAGNOSIS — K2100 Gastro-esophageal reflux disease with esophagitis, without bleeding: Secondary | ICD-10-CM | POA: Diagnosis not present

## 2020-05-06 DIAGNOSIS — R410 Disorientation, unspecified: Secondary | ICD-10-CM | POA: Diagnosis not present

## 2020-05-06 DIAGNOSIS — M359 Systemic involvement of connective tissue, unspecified: Secondary | ICD-10-CM | POA: Diagnosis not present

## 2020-05-06 DIAGNOSIS — R000 Tachycardia, unspecified: Secondary | ICD-10-CM | POA: Diagnosis not present

## 2020-05-06 DIAGNOSIS — N1832 Chronic kidney disease, stage 3b: Secondary | ICD-10-CM | POA: Diagnosis not present

## 2020-05-06 DIAGNOSIS — F22 Delusional disorders: Secondary | ICD-10-CM | POA: Diagnosis not present

## 2020-05-06 DIAGNOSIS — A8100 Creutzfeldt-Jakob disease, unspecified: Secondary | ICD-10-CM | POA: Diagnosis not present

## 2020-05-06 DIAGNOSIS — E872 Acidosis: Secondary | ICD-10-CM | POA: Diagnosis not present

## 2020-05-06 MED ORDER — GENERIC EXTERNAL MEDICATION
Status: DC
Start: ? — End: 2020-05-06

## 2020-05-06 MED ORDER — ALUM & MAG HYDROXIDE-SIMETH 200-200-20 MG/5ML PO SUSP
15.00 | ORAL | Status: DC
Start: ? — End: 2020-05-06

## 2020-05-06 MED ORDER — GEMFIBROZIL 600 MG PO TABS
600.00 | ORAL_TABLET | ORAL | Status: DC
Start: 2020-05-09 — End: 2020-05-06

## 2020-05-06 MED ORDER — PANTOPRAZOLE SODIUM 40 MG PO TBEC
40.00 | DELAYED_RELEASE_TABLET | ORAL | Status: DC
Start: 2020-05-09 — End: 2020-05-06

## 2020-05-06 MED ORDER — CLONIDINE HCL 0.1 MG PO TABS
0.10 | ORAL_TABLET | ORAL | Status: DC
Start: 2020-05-09 — End: 2020-05-06

## 2020-05-06 MED ORDER — OLANZAPINE 2.5 MG PO TABS
2.50 | ORAL_TABLET | ORAL | Status: DC
Start: 2020-05-06 — End: 2020-05-06

## 2020-05-06 MED ORDER — ATENOLOL 25 MG PO TABS
25.00 | ORAL_TABLET | ORAL | Status: DC
Start: 2020-05-09 — End: 2020-05-06

## 2020-05-06 MED ORDER — POLYETHYLENE GLYCOL 3350 17 GM/SCOOP PO POWD
17.00 | ORAL | Status: DC
Start: ? — End: 2020-05-06

## 2020-05-06 MED ORDER — ACETAMINOPHEN 325 MG PO TABS
650.00 | ORAL_TABLET | ORAL | Status: DC
Start: ? — End: 2020-05-06

## 2020-05-06 MED ORDER — LEVOTHYROXINE SODIUM 75 MCG PO TABS
75.00 | ORAL_TABLET | ORAL | Status: DC
Start: 2020-05-07 — End: 2020-05-06

## 2020-05-06 MED ORDER — PREDNISONE 5 MG PO TABS
5.00 | ORAL_TABLET | ORAL | Status: DC
Start: 2020-05-09 — End: 2020-05-06

## 2020-05-06 NOTE — ED Notes (Addendum)
[facility] here to transport pt; pt is refusing to go stating she needs a bath and she needs her medication; Dr. Betsey Holiday in to see pt but pt still refusing to be transported; pt's daughter called and spoke with pt and told her she needed to go; pt told her daughter she agreed to go. After pt hung up the phone, pt stated she had to take her thyroid medicine or "my whole body will shut down and if that happens someone is going to be sued". Dr. Betsey Holiday informed pt is still refusing and IVC paperwork is in process

## 2020-05-06 NOTE — ED Notes (Signed)
Pt will not go with transport.  Dr Betsey Holiday aware and plan to IVC pt for safe [facility].

## 2020-05-06 NOTE — ED Notes (Signed)
Patient refused temperature

## 2020-05-06 NOTE — ED Notes (Signed)
Pt given her breakfast tray; pt states "It's not time for breakfast, I need more of my medications, go ahead and call the Sheriff[employer] Department"; tray left on the table and pt encouraged to eat at her discretion; sitter at bedside

## 2020-05-06 NOTE — ED Notes (Signed)
Pt's daughter called and update given on pt's IVC status; Misty is requesting that her mother be transferred to [facility] if possible; this RN called [place] to give an update on pt's IVC paperwork filed

## 2020-05-06 NOTE — ED Notes (Signed)
Pt is sitting up eating breakfast at this time.

## 2020-05-06 NOTE — ED Notes (Signed)
Spoke with [facility] and informed them of the pt being IVC'd and they stated once the pt has been served she can still go to [facility]

## 2020-05-06 NOTE — ED Notes (Signed)
Called [facility] for transport to [place].

## 2020-05-09 MED ORDER — GENERIC EXTERNAL MEDICATION
Status: DC
Start: ? — End: 2020-05-09

## 2020-05-09 MED ORDER — LEVOTHYROXINE SODIUM 75 MCG PO TABS
75.00 | ORAL_TABLET | ORAL | Status: DC
Start: 2020-05-10 — End: 2020-05-09

## 2020-05-12 DIAGNOSIS — I96 Gangrene, not elsewhere classified: Secondary | ICD-10-CM | POA: Diagnosis not present

## 2020-05-12 DIAGNOSIS — E860 Dehydration: Secondary | ICD-10-CM | POA: Diagnosis not present

## 2020-05-12 DIAGNOSIS — S81801A Unspecified open wound, right lower leg, initial encounter: Secondary | ICD-10-CM | POA: Diagnosis not present

## 2020-05-12 DIAGNOSIS — E869 Volume depletion, unspecified: Secondary | ICD-10-CM | POA: Diagnosis not present

## 2020-05-12 DIAGNOSIS — J849 Interstitial pulmonary disease, unspecified: Secondary | ICD-10-CM | POA: Diagnosis not present

## 2020-05-12 DIAGNOSIS — I083 Combined rheumatic disorders of mitral, aortic and tricuspid valves: Secondary | ICD-10-CM | POA: Diagnosis not present

## 2020-05-12 DIAGNOSIS — M7989 Other specified soft tissue disorders: Secondary | ICD-10-CM | POA: Diagnosis not present

## 2020-05-12 DIAGNOSIS — E039 Hypothyroidism, unspecified: Secondary | ICD-10-CM | POA: Diagnosis not present

## 2020-05-12 DIAGNOSIS — R627 Adult failure to thrive: Secondary | ICD-10-CM | POA: Diagnosis not present

## 2020-05-12 DIAGNOSIS — S90822A Blister (nonthermal), left foot, initial encounter: Secondary | ICD-10-CM | POA: Diagnosis not present

## 2020-05-12 DIAGNOSIS — Z781 Physical restraint status: Secondary | ICD-10-CM | POA: Diagnosis not present

## 2020-05-12 DIAGNOSIS — M87074 Idiopathic aseptic necrosis of right foot: Secondary | ICD-10-CM | POA: Diagnosis not present

## 2020-05-12 DIAGNOSIS — Z431 Encounter for attention to gastrostomy: Secondary | ICD-10-CM | POA: Diagnosis not present

## 2020-05-12 DIAGNOSIS — R451 Restlessness and agitation: Secondary | ICD-10-CM | POA: Diagnosis not present

## 2020-05-12 DIAGNOSIS — N1832 Chronic kidney disease, stage 3b: Secondary | ICD-10-CM | POA: Diagnosis not present

## 2020-05-12 DIAGNOSIS — G9340 Encephalopathy, unspecified: Secondary | ICD-10-CM | POA: Diagnosis not present

## 2020-05-12 DIAGNOSIS — R836 Abnormal cytological findings in cerebrospinal fluid: Secondary | ICD-10-CM | POA: Diagnosis not present

## 2020-05-12 DIAGNOSIS — E43 Unspecified severe protein-calorie malnutrition: Secondary | ICD-10-CM | POA: Diagnosis not present

## 2020-05-12 DIAGNOSIS — Z515 Encounter for palliative care: Secondary | ICD-10-CM | POA: Diagnosis not present

## 2020-05-12 DIAGNOSIS — R238 Other skin changes: Secondary | ICD-10-CM | POA: Diagnosis not present

## 2020-05-12 DIAGNOSIS — F0391 Unspecified dementia with behavioral disturbance: Secondary | ICD-10-CM | POA: Diagnosis not present

## 2020-05-12 DIAGNOSIS — E872 Acidosis: Secondary | ICD-10-CM | POA: Diagnosis not present

## 2020-05-12 DIAGNOSIS — K279 Peptic ulcer, site unspecified, unspecified as acute or chronic, without hemorrhage or perforation: Secondary | ICD-10-CM | POA: Diagnosis not present

## 2020-05-12 DIAGNOSIS — M87075 Idiopathic aseptic necrosis of left foot: Secondary | ICD-10-CM | POA: Diagnosis not present

## 2020-05-12 DIAGNOSIS — R9082 White matter disease, unspecified: Secondary | ICD-10-CM | POA: Diagnosis not present

## 2020-05-12 DIAGNOSIS — K5730 Diverticulosis of large intestine without perforation or abscess without bleeding: Secondary | ICD-10-CM | POA: Diagnosis not present

## 2020-05-12 DIAGNOSIS — S90821A Blister (nonthermal), right foot, initial encounter: Secondary | ICD-10-CM | POA: Diagnosis not present

## 2020-05-12 DIAGNOSIS — J90 Pleural effusion, not elsewhere classified: Secondary | ICD-10-CM | POA: Diagnosis not present

## 2020-05-12 DIAGNOSIS — F29 Unspecified psychosis not due to a substance or known physiological condition: Secondary | ICD-10-CM | POA: Diagnosis not present

## 2020-05-12 DIAGNOSIS — K5900 Constipation, unspecified: Secondary | ICD-10-CM | POA: Diagnosis not present

## 2020-05-12 DIAGNOSIS — I6782 Cerebral ischemia: Secondary | ICD-10-CM | POA: Diagnosis not present

## 2020-05-12 DIAGNOSIS — E032 Hypothyroidism due to medicaments and other exogenous substances: Secondary | ICD-10-CM | POA: Diagnosis not present

## 2020-05-12 DIAGNOSIS — I70203 Unspecified atherosclerosis of native arteries of extremities, bilateral legs: Secondary | ICD-10-CM | POA: Diagnosis not present

## 2020-05-12 DIAGNOSIS — I739 Peripheral vascular disease, unspecified: Secondary | ICD-10-CM | POA: Diagnosis not present

## 2020-05-12 DIAGNOSIS — N170 Acute kidney failure with tubular necrosis: Secondary | ICD-10-CM | POA: Diagnosis not present

## 2020-05-12 DIAGNOSIS — K6389 Other specified diseases of intestine: Secondary | ICD-10-CM | POA: Diagnosis not present

## 2020-05-12 DIAGNOSIS — E870 Hyperosmolality and hypernatremia: Secondary | ICD-10-CM | POA: Diagnosis not present

## 2020-05-12 DIAGNOSIS — Z681 Body mass index (BMI) 19 or less, adult: Secondary | ICD-10-CM | POA: Diagnosis not present

## 2020-05-12 DIAGNOSIS — I7301 Raynaud's syndrome with gangrene: Secondary | ICD-10-CM | POA: Diagnosis not present

## 2020-05-12 DIAGNOSIS — Z66 Do not resuscitate: Secondary | ICD-10-CM | POA: Diagnosis not present

## 2020-05-12 DIAGNOSIS — R443 Hallucinations, unspecified: Secondary | ICD-10-CM | POA: Diagnosis not present

## 2020-05-12 DIAGNOSIS — R000 Tachycardia, unspecified: Secondary | ICD-10-CM | POA: Diagnosis not present

## 2020-05-12 DIAGNOSIS — J479 Bronchiectasis, uncomplicated: Secondary | ICD-10-CM | POA: Diagnosis not present

## 2020-05-12 DIAGNOSIS — E785 Hyperlipidemia, unspecified: Secondary | ICD-10-CM | POA: Diagnosis not present

## 2020-05-12 DIAGNOSIS — Z049 Encounter for examination and observation for unspecified reason: Secondary | ICD-10-CM | POA: Diagnosis not present

## 2020-05-12 DIAGNOSIS — A8100 Creutzfeldt-Jakob disease, unspecified: Secondary | ICD-10-CM | POA: Diagnosis not present

## 2020-05-12 DIAGNOSIS — N179 Acute kidney failure, unspecified: Secondary | ICD-10-CM | POA: Diagnosis not present

## 2020-05-12 DIAGNOSIS — R64 Cachexia: Secondary | ICD-10-CM | POA: Diagnosis not present

## 2020-05-15 MED ORDER — HEPARIN SOD (PORCINE) IN D5W 100 UNIT/ML IV SOLN
1.00 | INTRAVENOUS | Status: DC
Start: ? — End: 2020-05-15

## 2020-05-15 MED ORDER — DEXTROSE 5 % IV SOLN
125.00 | INTRAVENOUS | Status: DC
Start: ? — End: 2020-05-15

## 2020-05-15 MED ORDER — GENERIC EXTERNAL MEDICATION
Status: DC
Start: ? — End: 2020-05-15

## 2020-05-15 MED ORDER — GENERIC EXTERNAL MEDICATION
3.38 | Status: DC
Start: 2020-05-15 — End: 2020-05-15

## 2020-05-15 MED ORDER — OLANZAPINE 5 MG PO TBDP
10.00 | ORAL_TABLET | ORAL | Status: DC
Start: 2020-05-15 — End: 2020-05-15

## 2020-05-15 MED ORDER — DEXMEDETOMIDINE HCL IN NACL 400 MCG/100ML IV SOLN
0.10 | INTRAVENOUS | Status: DC
Start: ? — End: 2020-05-15

## 2020-05-15 MED ORDER — THERA PO TABS
1.00 | ORAL_TABLET | ORAL | Status: DC
Start: 2020-05-16 — End: 2020-05-15

## 2020-05-15 MED ORDER — SODIUM CHLORIDE 0.9 % IV SOLN
10.00 | INTRAVENOUS | Status: DC
Start: ? — End: 2020-05-15

## 2020-05-15 MED ORDER — MUPIROCIN 2 % EX OINT
TOPICAL_OINTMENT | CUTANEOUS | Status: DC
Start: 2020-05-15 — End: 2020-05-15

## 2020-05-15 MED ORDER — METOPROLOL TARTRATE 25 MG PO TABS
25.00 | ORAL_TABLET | ORAL | Status: DC
Start: 2020-05-15 — End: 2020-05-15

## 2020-05-15 MED ORDER — GENERIC EXTERNAL MEDICATION
50.00 | Status: DC
Start: 2020-05-16 — End: 2020-05-15

## 2020-05-15 MED ORDER — OLANZAPINE 5 MG PO TBDP
5.00 | ORAL_TABLET | ORAL | Status: DC
Start: ? — End: 2020-05-15

## 2020-05-20 DIAGNOSIS — S81801A Unspecified open wound, right lower leg, initial encounter: Secondary | ICD-10-CM | POA: Diagnosis not present

## 2020-05-20 DIAGNOSIS — F0391 Unspecified dementia with behavioral disturbance: Secondary | ICD-10-CM | POA: Diagnosis not present

## 2020-05-20 DIAGNOSIS — R64 Cachexia: Secondary | ICD-10-CM | POA: Diagnosis not present

## 2020-05-20 DIAGNOSIS — R4182 Altered mental status, unspecified: Secondary | ICD-10-CM | POA: Diagnosis not present

## 2020-05-20 DIAGNOSIS — S81802A Unspecified open wound, left lower leg, initial encounter: Secondary | ICD-10-CM | POA: Diagnosis not present

## 2020-05-20 DIAGNOSIS — N170 Acute kidney failure with tubular necrosis: Secondary | ICD-10-CM | POA: Diagnosis not present

## 2020-05-20 DIAGNOSIS — Z515 Encounter for palliative care: Secondary | ICD-10-CM | POA: Diagnosis not present

## 2020-05-20 DIAGNOSIS — Z781 Physical restraint status: Secondary | ICD-10-CM | POA: Diagnosis not present

## 2020-05-20 DIAGNOSIS — I447 Left bundle-branch block, unspecified: Secondary | ICD-10-CM | POA: Diagnosis not present

## 2020-05-20 DIAGNOSIS — E870 Hyperosmolality and hypernatremia: Secondary | ICD-10-CM | POA: Diagnosis not present

## 2020-05-20 DIAGNOSIS — I96 Gangrene, not elsewhere classified: Secondary | ICD-10-CM | POA: Diagnosis not present

## 2020-05-20 DIAGNOSIS — I7301 Raynaud's syndrome with gangrene: Secondary | ICD-10-CM | POA: Diagnosis not present

## 2020-05-20 DIAGNOSIS — Z66 Do not resuscitate: Secondary | ICD-10-CM | POA: Diagnosis not present

## 2020-05-20 DIAGNOSIS — R000 Tachycardia, unspecified: Secondary | ICD-10-CM | POA: Diagnosis not present

## 2020-05-20 DIAGNOSIS — E46 Unspecified protein-calorie malnutrition: Secondary | ICD-10-CM | POA: Diagnosis not present

## 2020-05-20 DIAGNOSIS — R443 Hallucinations, unspecified: Secondary | ICD-10-CM | POA: Diagnosis not present

## 2020-05-27 MED ORDER — POVIDONE-IODINE 10 % EX OINT
TOPICAL_OINTMENT | CUTANEOUS | Status: DC
Start: ? — End: 2020-05-27

## 2020-05-27 MED ORDER — DSS 100 MG PO CAPS
100.00 | ORAL_CAPSULE | ORAL | Status: DC
Start: ? — End: 2020-05-27

## 2020-05-27 MED ORDER — LORAZEPAM 1 MG PO TABS
1.00 | ORAL_TABLET | ORAL | Status: DC
Start: ? — End: 2020-05-27

## 2020-05-27 MED ORDER — DEXTROSE 10 % IV SOLN
125.00 | INTRAVENOUS | Status: DC
Start: ? — End: 2020-05-27

## 2020-05-27 MED ORDER — ACETAMINOPHEN 325 MG PO TABS
650.00 | ORAL_TABLET | ORAL | Status: DC
Start: ? — End: 2020-05-27

## 2020-05-27 MED ORDER — BISACODYL 10 MG RE SUPP
10.00 | RECTAL | Status: DC
Start: ? — End: 2020-05-27

## 2020-05-27 MED ORDER — GLYCOPYRROLATE 1 MG/5ML PO SOLN
1.00 | ORAL | Status: DC
Start: ? — End: 2020-05-27

## 2020-05-27 MED ORDER — CARBOXYMETHYLCELLULOSE SODIUM 0.5 % OP SOLN
1.00 | OPHTHALMIC | Status: DC
Start: ? — End: 2020-05-27

## 2020-05-27 MED ORDER — OLANZAPINE 5 MG PO TBDP
5.00 | ORAL_TABLET | ORAL | Status: DC
Start: ? — End: 2020-05-27

## 2020-05-27 MED ORDER — POLYETHYLENE GLYCOL 3350 17 GM/SCOOP PO POWD
17.00 | ORAL | Status: DC
Start: ? — End: 2020-05-27

## 2020-05-27 MED ORDER — GLUCOSE 40 % PO GEL
15.00 | ORAL | Status: DC
Start: ? — End: 2020-05-27

## 2020-05-27 MED ORDER — MORPHINE SULFATE 10 MG/5ML PO SOLN
5.00 | ORAL | Status: DC
Start: ? — End: 2020-05-27

## 2020-05-27 MED ORDER — SENNOSIDES 8.6 MG PO TABS
8.60 | ORAL_TABLET | ORAL | Status: DC
Start: 2020-05-26 — End: 2020-05-27

## 2020-05-27 MED ORDER — BISACODYL 5 MG PO TBEC
10.00 | DELAYED_RELEASE_TABLET | ORAL | Status: DC
Start: ? — End: 2020-05-27

## 2020-05-27 MED ORDER — SCOPOLAMINE 1 MG/3DAYS TD PT72
1.00 | MEDICATED_PATCH | TRANSDERMAL | Status: DC
Start: 2020-05-28 — End: 2020-05-27

## 2020-05-31 DEATH — deceased

## 2020-09-08 IMAGING — DX DG CHEST 2V
2 series · 2 of 2 positions shown · non-contrast
Comparison: 12/20/2018

CLINICAL DATA: Psychiatric evaluation, history of hypertension

EXAM:
CHEST - 2 VIEW

[chest pa]
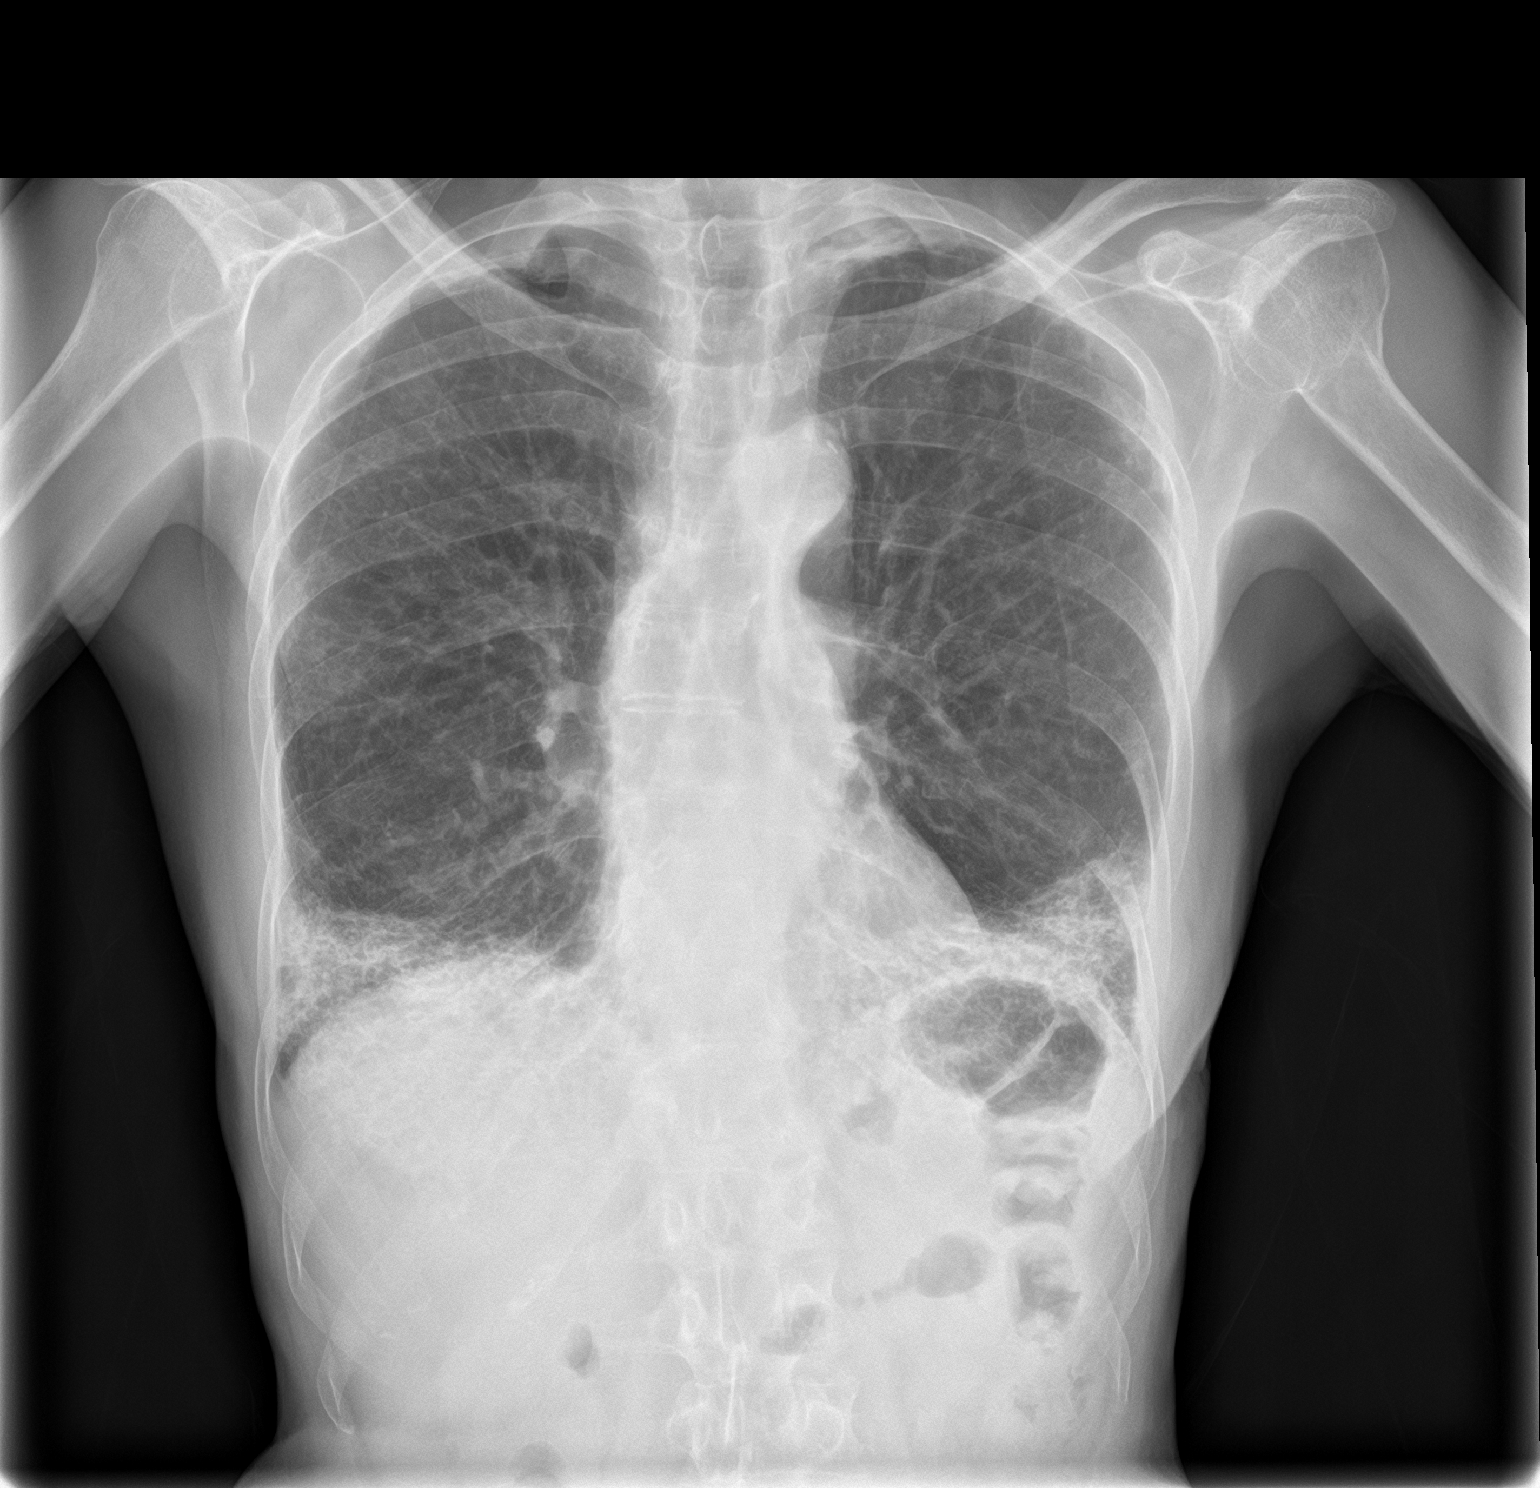

[chest lat]
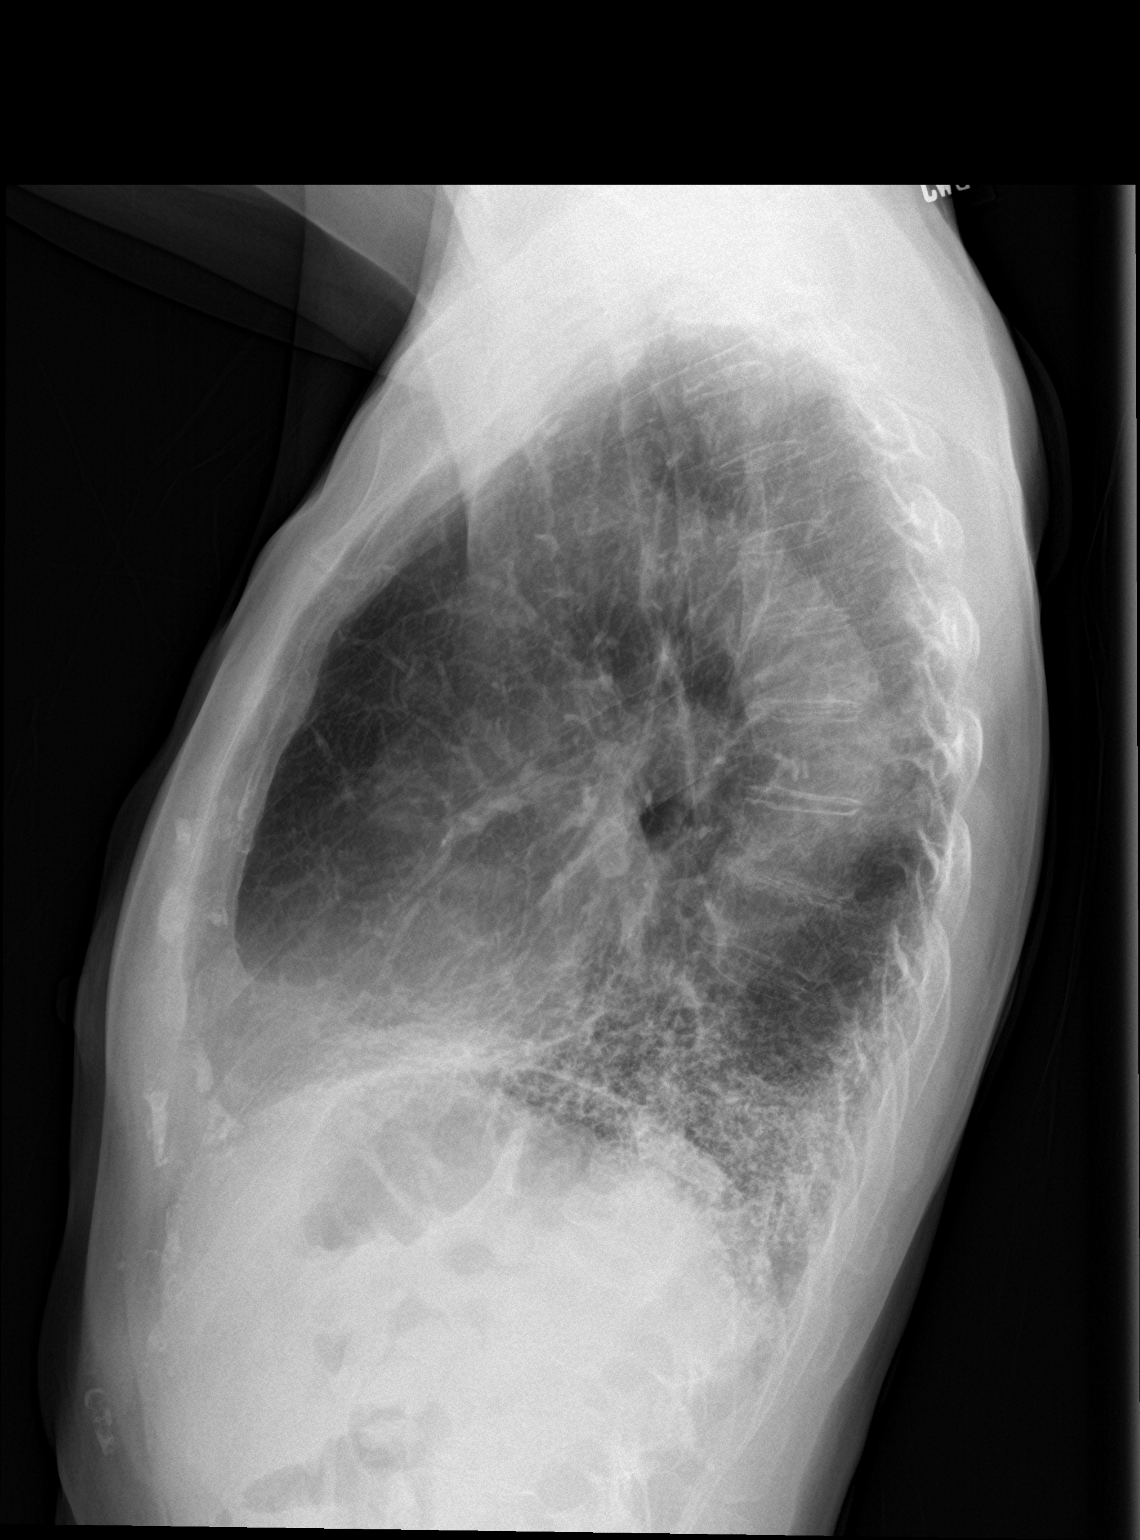

[2 of 2 positions shown; findings below may reference images not displayed]

FINDINGS: Frontal and lateral views of the chest demonstrate a stable cardiac
silhouette. There is background emphysema. Bibasilar scarring and
fibrosis within the lungs demonstrates mild progression since prior
study. No new airspace disease, effusion, or pneumothorax. No acute
bony abnormalities.
IMPRESSION: 1. Emphysema, with progressive bibasilar scarring and fibrosis.
2. No acute airspace disease.

## 2020-09-13 ENCOUNTER — Ambulatory Visit (INDEPENDENT_AMBULATORY_CARE_PROVIDER_SITE_OTHER): Payer: Medicare HMO | Admitting: Internal Medicine

## 2020-09-29 ENCOUNTER — Encounter (INDEPENDENT_AMBULATORY_CARE_PROVIDER_SITE_OTHER): Payer: Self-pay | Admitting: *Deleted
